# Patient Record
Sex: Female | Born: 1960 | Race: Black or African American | Hispanic: No | Marital: Married | Smoking: Never smoker
Health system: Southern US, Community
[De-identification: ages and names within clinical notes are randomized; demographics above are authoritative.]

---

## 2015-11-27 ENCOUNTER — Emergency Department (HOSPITAL_COMMUNITY)

## 2015-11-27 ENCOUNTER — Encounter (HOSPITAL_COMMUNITY): Payer: Self-pay | Admitting: *Deleted

## 2015-11-27 ENCOUNTER — Inpatient Hospital Stay (HOSPITAL_COMMUNITY)
Admission: EM | Admit: 2015-11-27 | Discharge: 2015-12-09 | DRG: 409 | Disposition: A | Discharge status: Home Health | Attending: General Surgery | Admitting: General Surgery

## 2015-11-27 DIAGNOSIS — Y92234 Operating room of hospital as the place of occurrence of the external cause: Secondary | ICD-10-CM | POA: Diagnosis present

## 2015-11-27 DIAGNOSIS — K8020 Calculus of gallbladder without cholecystitis without obstruction: Secondary | ICD-10-CM

## 2015-11-27 DIAGNOSIS — Y658 Other specified misadventures during surgical and medical care: Secondary | ICD-10-CM | POA: Diagnosis not present

## 2015-11-27 DIAGNOSIS — K219 Gastro-esophageal reflux disease without esophagitis: Secondary | ICD-10-CM | POA: Diagnosis present

## 2015-11-27 DIAGNOSIS — K8000 Calculus of gallbladder with acute cholecystitis without obstruction: Secondary | ICD-10-CM | POA: Diagnosis not present

## 2015-11-27 DIAGNOSIS — K9171 Accidental puncture and laceration of a digestive system organ or structure during a digestive system procedure: Secondary | ICD-10-CM | POA: Diagnosis not present

## 2015-11-27 DIAGNOSIS — K810 Acute cholecystitis: Secondary | ICD-10-CM

## 2015-11-27 DIAGNOSIS — Z79899 Other long term (current) drug therapy: Secondary | ICD-10-CM | POA: Diagnosis not present

## 2015-11-27 DIAGNOSIS — Z5331 Laparoscopic surgical procedure converted to open procedure: Secondary | ICD-10-CM | POA: Diagnosis not present

## 2015-11-27 DIAGNOSIS — K838 Other specified diseases of biliary tract: Secondary | ICD-10-CM

## 2015-11-27 DIAGNOSIS — R112 Nausea with vomiting, unspecified: Secondary | ICD-10-CM | POA: Diagnosis present

## 2015-11-27 DIAGNOSIS — R1011 Right upper quadrant pain: Secondary | ICD-10-CM

## 2015-11-27 LAB — COMPREHENSIVE METABOLIC PANEL
ALK PHOS: 77 U/L (ref 38–126)
ALT: 16 U/L (ref 14–54)
AST: 28 U/L (ref 15–41)
Albumin: 4 g/dL (ref 3.5–5.0)
Anion gap: 15 (ref 5–15)
BUN: 6 mg/dL (ref 6–20)
CALCIUM: 10.1 mg/dL (ref 8.9–10.3)
CO2: 21 mmol/L — AB (ref 22–32)
CREATININE: 0.85 mg/dL (ref 0.44–1.00)
Chloride: 104 mmol/L (ref 101–111)
GFR calc non Af Amer: >60 mL/min (ref >60)
Glucose, Bld: 129 mg/dL — ABNORMAL HIGH (ref 65–99)
Potassium: 3.5 mmol/L (ref 3.5–5.1)
SODIUM: 140 mmol/L (ref 135–145)
Total Bilirubin: 0.5 mg/dL (ref 0.3–1.2)
Total Protein: 8.6 g/dL — ABNORMAL HIGH (ref 6.5–8.1)

## 2015-11-27 LAB — CBC
HCT: 44.1 % (ref 36.0–46.0)
Hemoglobin: 14.2 g/dL (ref 12.0–15.0)
MCH: 27.4 pg (ref 26.0–34.0)
MCHC: 32.2 g/dL (ref 30.0–36.0)
MCV: 85.1 fL (ref 78.0–100.0)
PLATELETS: 323 10*3/uL (ref 150–400)
RBC: 5.18 MIL/uL — AB (ref 3.87–5.11)
RDW: 13.7 % (ref 11.5–15.5)
WBC: 7.8 10*3/uL (ref 4.0–10.5)

## 2015-11-27 LAB — LIPASE, BLOOD: Lipase: 21 U/L (ref 11–51)

## 2015-11-27 LAB — SURGICAL PCR SCREEN
MRSA, PCR: NEGATIVE
STAPHYLOCOCCUS AUREUS: NEGATIVE

## 2015-11-27 MED ORDER — FAMOTIDINE IN NACL 20-0.9 MG/50ML-% IV SOLN
20.0000 mg | Freq: Two times a day (BID) | INTRAVENOUS | Status: DC
  Administered 2015-11-27: 20 mg via INTRAVENOUS
  Filled 2015-11-27 (×2): qty 50

## 2015-11-27 MED ORDER — MORPHINE SULFATE (PF) 4 MG/ML IV SOLN
4.0000 mg | Freq: Once | INTRAVENOUS | Status: AC
  Administered 2015-11-27: 4 mg via INTRAVENOUS
  Filled 2015-11-27: qty 1

## 2015-11-27 MED ORDER — DIPHENHYDRAMINE HCL 25 MG PO CAPS
25.0000 mg | ORAL_CAPSULE | Freq: Four times a day (QID) | ORAL | Status: DC | PRN
  Administered 2015-11-27: 25 mg via ORAL
  Filled 2015-11-27: qty 1

## 2015-11-27 MED ORDER — OXYCODONE-ACETAMINOPHEN 5-325 MG PO TABS
1.0000 | ORAL_TABLET | ORAL | Status: DC | PRN
  Administered 2015-11-27 (×2): 1 via ORAL
  Filled 2015-11-27 (×2): qty 1

## 2015-11-27 MED ORDER — DEXTROSE 5 % IV SOLN
2.0000 g | INTRAVENOUS | Status: DC
  Administered 2015-11-27 – 2015-11-28 (×2): 2 g via INTRAVENOUS
  Filled 2015-11-27 (×3): qty 2

## 2015-11-27 MED ORDER — ONDANSETRON HCL 4 MG/2ML IJ SOLN
4.0000 mg | Freq: Four times a day (QID) | INTRAMUSCULAR | Status: DC | PRN
  Administered 2015-11-27: 4 mg via INTRAVENOUS
  Filled 2015-11-27: qty 2

## 2015-11-27 MED ORDER — ONDANSETRON HCL 4 MG/2ML IJ SOLN
4.0000 mg | Freq: Once | INTRAMUSCULAR | Status: AC
  Administered 2015-11-27: 4 mg via INTRAVENOUS
  Filled 2015-11-27: qty 2

## 2015-11-27 MED ORDER — DIPHENHYDRAMINE HCL 50 MG/ML IJ SOLN: 25.0000 mg | Freq: Four times a day (QID) | INTRAMUSCULAR | Status: DC | PRN

## 2015-11-27 MED ORDER — ACETAMINOPHEN 325 MG PO TABS
650.0000 mg | ORAL_TABLET | Freq: Four times a day (QID) | ORAL | Status: DC | PRN
  Administered 2015-11-28: 650 mg via ORAL
  Filled 2015-11-27: qty 2

## 2015-11-27 MED ORDER — POTASSIUM CHLORIDE IN NACL 20-0.9 MEQ/L-% IV SOLN
INTRAVENOUS | Status: DC
  Administered 2015-11-27: 13:00:00 via INTRAVENOUS
  Filled 2015-11-27 (×4): qty 1000

## 2015-11-27 MED ORDER — HYDROMORPHONE HCL 1 MG/ML IJ SOLN: 0.5000 mg | INTRAMUSCULAR | Status: DC | PRN

## 2015-11-27 MED ORDER — SODIUM CHLORIDE 0.9 % IV BOLUS (SEPSIS)
1000.0000 mL | Freq: Once | INTRAVENOUS | Status: AC
  Administered 2015-11-27: 1000 mL via INTRAVENOUS

## 2015-11-27 MED ORDER — ONDANSETRON 4 MG PO TBDP: 4.0000 mg | ORAL_TABLET | Freq: Four times a day (QID) | ORAL | Status: DC | PRN

## 2015-11-27 MED ORDER — PANTOPRAZOLE SODIUM 40 MG PO TBEC
40.0000 mg | DELAYED_RELEASE_TABLET | Freq: Every day | ORAL | Status: DC
  Administered 2015-11-27: 40 mg via ORAL
  Filled 2015-11-27: qty 1

## 2015-11-27 NOTE — ED Provider Notes (Signed)
CSN: [id number]     Arrival date & time 11/27/15  [id number] History   First MD Initiated Contact with Patient 11/27/15 0831     Chief Complaint  Patient presents with  . Abdominal Pain  . Emesis   (Consider location/radiation/quality/duration/timing/severity/associated sxs/prior Treatment) HPI 55 y.o. female presents to the Emergency Department today complaining of abdominal pain since last night around 7pm. Pt states she [facility] [employer] and felt immediate pain afterwards. Associated N/V x7. No diarrhea. States pain is 10/10 epigastrium with RUQ pain as well. OTC medication without relief. Notes pain feels like a constant twisting sensation. No fevers. No CP/SOB. No Dysuria. No abdominal surgeries. No other symptoms noted    History reviewed. No pertinent past medical history. History reviewed. No pertinent past surgical history. No family history on file. Social History  Substance Use Topics  . Smoking status: Never Smoker   . Smokeless tobacco: None  . Alcohol Use: No   OB History    No data available     Review of Systems ROS reviewed and all are negative for acute change except as noted in the HPI.  Allergies  Review of patient's allergies indicates no known allergies.  Home Medications   Prior to Admission medications   Not on File   BP 156/96 mmHg  Pulse 51  Temp(Src) 98.4 F (36.9 C) (Oral)  Resp 18  Ht 5\' 2"  (1.575 m)  Wt 74.844 kg  BMI 30.17 kg/m2  SpO2 100%  LMP 11/27/2010   Physical Exam  Constitutional: She is oriented to person, place, and time. She appears well-developed and well-nourished.  HENT:  Head: Normocephalic and atraumatic.  Eyes: EOM are normal. Pupils are equal, round, and reactive to light.  Neck: Normal range of motion. Neck supple. No tracheal deviation present.  Cardiovascular: Normal rate, regular rhythm, normal heart sounds and intact distal pulses.   No murmur heard. Pulmonary/Chest: Effort normal and breath sounds normal. No  respiratory distress. She has no wheezes. She has no rales. She exhibits no tenderness.  Abdominal: Soft. Normal appearance and bowel sounds are normal. There is tenderness in the right upper quadrant and epigastric area. There is positive Murphy's sign. There is no rigidity, no rebound, no guarding and no tenderness at McBurney's point.  Musculoskeletal: Normal range of motion.  Neurological: She is alert and oriented to person, place, and time.  Skin: Skin is warm and dry.  Psychiatric: She has a normal mood and affect. Her behavior is normal. Thought content normal.  Nursing note and vitals reviewed.  ED Course  Procedures (including critical care time) Labs Review Labs Reviewed  COMPREHENSIVE METABOLIC PANEL - Abnormal; Notable for the following:    CO2 21 (*)    Glucose, Bld 129 (*)    Total Protein 8.6 (*)    All other components within normal limits  CBC - Abnormal; Notable for the following:    RBC 5.18 (*)    All other components within normal limits  LIPASE, BLOOD   Imaging Review [place] Abdomen Limited Ruq  11/27/2015  CLINICAL DATA:  Mid abdominal pain since 7 p.m.  Vomiting. EXAM: US ABDOMEN LIMITED - RIGHT UPPER QUADRANT COMPARISON:  None. FINDINGS: Gallbladder: There are echogenic gallstones, largest measuring 1.2 cm. Reportedly, the patient does have a sonographic Murphy's sign. Mild gallbladder wall thickening measuring up to 0.4 cm. No significant distention of the gallbladder. Areas of edematous gallbladder wall. Common bile duct: Diameter: 0.6 cm. Liver: No focal lesion identified. Within normal  limits in parenchymal echogenicity. Flow in the main portal vein. IMPRESSION: Cholelithiasis with gallbladder wall thickening and positive sonographic Murphy's sign. Findings are suggestive for acute cholecystitis. Electronically Signed   By: Richarda Overlie M.D.   On: 11/27/2015 10:19   I have personally reviewed and evaluated these images and lab results as part of my medical  decision-making.   EKG Interpretation None      MDM  I have reviewed and evaluated the relevant laboratory values I have reviewed and evaluated the relevant imaging studies. I have reviewed the relevant previous healthcare records. I obtained HPI from historian. Patient discussed with supervising physician  ED Course:  Assessment: Pt is a 55yF who presents with RUQ pain with onset last night. On exam, pt in NAD. Nontoxic/nonseptic appearing. VSS. Afebrile. Lungs CTA. Heart RRR. Abdomen TTP RUQ. Pos Murphys. Lipase neg. Labs unremarkable. US showed acute cholecystitis. Made NPO. Given analgesia, zofran, fluids in ED. Plan is to Admit to General Surgery. Pain controlled on reexamination.    Disposition/Plan:  Admit to General Surgery Pt acknowledges and agrees with plan  Supervising Physician Derwood Kaplan, MD   Final diagnoses:  RUQ pain  Acute cholecystitis      Audry Pili, PA-C 11/27/15 1051  Derwood Kaplan, MD 11/27/15 1625

## 2015-11-27 NOTE — Progress Notes (Signed)
Dr. Andrey CampanileWilson will not be able to do her this afternoon, so we are giving her clear liquids, and PO pain meds as needed she is NPO and we are aiming for 8 AM tomorrow.  Pt informed and she is comfortable with the plan.  She has no questions.

## 2015-11-27 NOTE — ED Notes (Signed)
Patient transported to Ultrasound 

## 2015-11-27 NOTE — ED Notes (Signed)
Pt states abdominal pain and emesis since eating at a restaurant last night.

## 2015-11-27 NOTE — H&P (Signed)
Tiffany Warren is an 55 y.o. female.   Chief Complaint: n/v HPI: 55 year old female who is otherwise healthy who developed acute onset of epigastric pain last evening after eating some food at [employer]. She had multiple episodes of nausea and vomiting overnight. The pain persisted. It went to her back a little bit. She denies prior symptoms. However she has had some issues with heartburn over the past few months and is on protonic. She denies any weight loss. She denies any frequent NSAID use. She denies any jaundice or melena or hematochezia. She denies any prior abdominal surgery. She denies tobacco use. Because the pain was persistent she came to the emergency room this morning.  History reviewed. No pertinent past medical history.  History reviewed. No pertinent past surgical history.  No family history on file. Social History:  reports that she has never smoked. She does not have any smokeless tobacco history on file. She reports that she does not drink alcohol or use illicit drugs.  Allergies: No Known Allergies  Medications Prior to Admission  Medication Sig Dispense Refill  . pantoprazole (PROTONIX) 40 MG tablet Take 40 mg by mouth daily.      Results for orders placed or performed during the hospital encounter of 11/27/15 (from the past 48 hour(s))  Lipase, blood     Status: None   Collection Time: 11/27/15  9:15 AM  Result Value Ref Range   Lipase 21 11 - 51 U/L  Comprehensive metabolic panel     Status: Abnormal   Collection Time: 11/27/15  9:15 AM  Result Value Ref Range   Sodium 140 135 - 145 mmol/L   Potassium 3.5 3.5 - 5.1 mmol/L   Chloride 104 101 - 111 mmol/L   CO2 21 (L) 22 - 32 mmol/L   Glucose, Bld 129 (H) 65 - 99 mg/dL   BUN 6 6 - 20 mg/dL   Creatinine, Ser 0.85 0.44 - 1.00 mg/dL   Calcium 10.1 8.9 - 10.3 mg/dL   Total Protein 8.6 (H) 6.5 - 8.1 g/dL   Albumin 4.0 3.5 - 5.0 g/dL   AST 28 15 - 41 U/L   ALT 16 14 - 54 U/L   Alkaline Phosphatase 77 38 -  126 U/L   Total Bilirubin 0.5 0.3 - 1.2 mg/dL   GFR calc non Af Amer >60 >60 mL/min   GFR calc Af Amer >60 >60 mL/min    Comment: (NOTE) The eGFR has been calculated using the CKD EPI equation. This calculation has not been validated in all clinical situations. eGFR's persistently <60 mL/min signify possible Chronic Kidney Disease.    Anion gap 15 5 - 15  CBC     Status: Abnormal   Collection Time: 11/27/15  9:15 AM  Result Value Ref Range   WBC 7.8 4.0 - 10.5 K/uL   RBC 5.18 (H) 3.87 - 5.11 MIL/uL   Hemoglobin 14.2 12.0 - 15.0 g/dL   HCT 44.1 36.0 - 46.0 %   MCV 85.1 78.0 - 100.0 fL   MCH 27.4 26.0 - 34.0 pg   MCHC 32.2 30.0 - 36.0 g/dL   RDW 13.7 11.5 - 15.5 %   Platelets 323 150 - 400 K/uL   US Abdomen Limited Ruq  11/27/2015  CLINICAL DATA:  Mid abdominal pain since 7 p.m.  Vomiting. EXAM: US ABDOMEN LIMITED - RIGHT UPPER QUADRANT COMPARISON:  None. FINDINGS: Gallbladder: There are echogenic gallstones, largest measuring 1.2 cm. Reportedly, the patient does have a sonographic Murphy's  sign. Mild gallbladder wall thickening measuring up to 0.4 cm. No significant distention of the gallbladder. Areas of edematous gallbladder wall. Common bile duct: Diameter: 0.6 cm. Liver: No focal lesion identified. Within normal limits in parenchymal echogenicity. Flow in the main portal vein. IMPRESSION: Cholelithiasis with gallbladder wall thickening and positive sonographic Murphy's sign. Findings are suggestive for acute cholecystitis. Electronically Signed   By: Markus Daft M.D.   On: 11/27/2015 10:19    Review of Systems  Constitutional: Positive for chills. Negative for fever and weight loss.  HENT: Negative for nosebleeds.   Eyes: Negative for blurred vision.  Respiratory: Negative for shortness of breath.   Cardiovascular: Negative for chest pain, palpitations, orthopnea and PND.       Some DOE  Gastrointestinal: Positive for heartburn, nausea, vomiting and abdominal pain. Negative for  diarrhea and constipation.  Genitourinary: Negative for dysuria and hematuria.  Musculoskeletal: Negative.   Skin: Negative for itching and rash.  Neurological: Negative for dizziness, focal weakness, seizures, loss of consciousness and headaches.       Denies TIAs, amaurosis fugax  Endo/Heme/Allergies: Does not bruise/bleed easily.  Psychiatric/Behavioral: The patient is not nervous/anxious.     Blood pressure 153/77, pulse 47, temperature 98.2 F (36.8 C), temperature source Oral, resp. rate 18, height 5' 3"  (1.6 m), weight 74.844 kg (165 lb), last menstrual period 11/27/2010, SpO2 100 %. Physical Exam  Vitals reviewed. Constitutional: She is oriented to person, place, and time. She appears well-developed and well-nourished. No distress.  HENT:  Head: Normocephalic and atraumatic.  Right Ear: External ear normal.  Left Ear: External ear normal.  Eyes: Conjunctivae are normal. No scleral icterus.  Neck: Normal range of motion. Neck supple. No tracheal deviation present. No thyromegaly present.  Cardiovascular: Normal rate and normal heart sounds.   Respiratory: Effort normal and breath sounds normal. No stridor. No respiratory distress. She has no wheezes.  GI: Soft. She exhibits no distension. There is tenderness in the epigastric area. There is no rebound and no guarding.    Musculoskeletal: She exhibits no edema or tenderness.  Lymphadenopathy:    She has no cervical adenopathy.  Neurological: She is alert and oriented to person, place, and time. She exhibits normal muscle tone.  Skin: Skin is warm and dry. No rash noted. She is not diaphoretic. No erythema. No pallor.  Psychiatric: She has a normal mood and affect. Her behavior is normal. Judgment and thought content normal.     Assessment/Plan Early acute calculous cholecystitis GERD  I believe the patient's symptoms are consistent with gallbladder disease.  We discussed gallbladder disease. We discussed non-operative  and operative management. We discussed the signs & symptoms of acute cholecystitis  I discussed laparoscopic cholecystectomy with IOC in detail.  The patient was shown diagrams detailing the procedure.  We discussed the risks and benefits of a laparoscopic cholecystectomy including, but not limited to bleeding, infection, injury to surrounding structures such as the intestine or liver, bile leak, retained gallstones, need to convert to an open procedure, prolonged diarrhea, blood clots such as  DVT, common bile duct injury, anesthesia risks, and possible need for additional procedures.  We discussed the typical post-operative recovery course. I explained that the likelihood of improvement of their symptoms is good.  Plan admit for ivf, pain control, iv abx Due to operative schedule, surgery unlikely to occur today  Plan surgery in AM NPO p mn  Leighton Ruff. Redmond Pulling, MD, FACS General, Bariatric, & Minimally Invasive Surgery [facility]  [place]    Gayland Curry, MD 11/27/2015, 12:43 PM

## 2015-11-28 ENCOUNTER — Encounter (HOSPITAL_COMMUNITY): Payer: Self-pay

## 2015-11-28 ENCOUNTER — Encounter (HOSPITAL_COMMUNITY): Admission: EM | Disposition: A | Discharge status: Home Health | Payer: Self-pay | Source: Home / Self Care

## 2015-11-28 ENCOUNTER — Observation Stay (HOSPITAL_COMMUNITY): Admitting: Certified Registered Nurse Anesthetist

## 2015-11-28 ENCOUNTER — Observation Stay (HOSPITAL_COMMUNITY)

## 2015-11-28 HISTORY — PX: CHOLECYSTECTOMY: SHX55

## 2015-11-28 LAB — COMPREHENSIVE METABOLIC PANEL
ALBUMIN: 2.7 g/dL — AB (ref 3.5–5.0)
ALK PHOS: 56 U/L (ref 38–126)
ALT: 13 U/L — AB (ref 14–54)
AST: 18 U/L (ref 15–41)
Anion gap: 8 (ref 5–15)
BILIRUBIN TOTAL: 0.4 mg/dL (ref 0.3–1.2)
CALCIUM: 8.8 mg/dL — AB (ref 8.9–10.3)
CO2: 25 mmol/L (ref 22–32)
Chloride: 106 mmol/L (ref 101–111)
Creatinine, Ser: 0.89 mg/dL (ref 0.44–1.00)
GFR calc Af Amer: >60 mL/min (ref >60)
GFR calc non Af Amer: >60 mL/min (ref >60)
GLUCOSE: 110 mg/dL — AB (ref 65–99)
Potassium: 3.9 mmol/L (ref 3.5–5.1)
SODIUM: 139 mmol/L (ref 135–145)
TOTAL PROTEIN: 5.8 g/dL — AB (ref 6.5–8.1)

## 2015-11-28 LAB — CBC
HCT: 38 % (ref 36.0–46.0)
HEMOGLOBIN: 11.7 g/dL — AB (ref 12.0–15.0)
MCH: 26.6 pg (ref 26.0–34.0)
MCHC: 30.8 g/dL (ref 30.0–36.0)
MCV: 86.4 fL (ref 78.0–100.0)
Platelets: 233 10*3/uL (ref 150–400)
RBC: 4.4 MIL/uL (ref 3.87–5.11)
RDW: 14.2 % (ref 11.5–15.5)
WBC: 5.6 10*3/uL (ref 4.0–10.5)

## 2015-11-28 LAB — LIPASE, BLOOD: LIPASE: 20 U/L (ref 11–51)

## 2015-11-28 SURGERY — CHOLECYSTECTOMY
Anesthesia: General | Site: Abdomen

## 2015-11-28 MED ORDER — LACTATED RINGERS IV SOLN
INTRAVENOUS | Status: DC
  Administered 2015-11-28 (×3): via INTRAVENOUS

## 2015-11-28 MED ORDER — IOPAMIDOL (ISOVUE-300) INJECTION 61%
INTRAVENOUS | Status: DC | PRN
  Administered 2015-11-28: 15 mL

## 2015-11-28 MED ORDER — PHENYLEPHRINE 40 MCG/ML (10ML) SYRINGE FOR IV PUSH (FOR BLOOD PRESSURE SUPPORT)
PREFILLED_SYRINGE | INTRAVENOUS | Status: DC | PRN
  Administered 2015-11-28: 40 ug via INTRAVENOUS
  Administered 2015-11-28 (×2): 80 ug via INTRAVENOUS

## 2015-11-28 MED ORDER — FENTANYL CITRATE (PF) 250 MCG/5ML IJ SOLN
INTRAMUSCULAR | Status: AC
  Filled 2015-11-28: qty 5

## 2015-11-28 MED ORDER — IOPAMIDOL (ISOVUE-300) INJECTION 61%
INTRAVENOUS | Status: AC
  Filled 2015-11-28: qty 50

## 2015-11-28 MED ORDER — BUPIVACAINE-EPINEPHRINE (PF) 0.25% -1:200000 IJ SOLN
INTRAMUSCULAR | Status: AC
  Filled 2015-11-28: qty 30

## 2015-11-28 MED ORDER — KETOROLAC TROMETHAMINE 30 MG/ML IJ SOLN: 30.0000 mg | Freq: Once | INTRAMUSCULAR | Status: DC | PRN

## 2015-11-28 MED ORDER — ONDANSETRON HCL 4 MG/2ML IJ SOLN
INTRAMUSCULAR | Status: AC
  Filled 2015-11-28: qty 2

## 2015-11-28 MED ORDER — ONDANSETRON 4 MG PO TBDP
4.0000 mg | ORAL_TABLET | Freq: Four times a day (QID) | ORAL | Status: DC | PRN
  Administered 2015-12-07: 4 mg via ORAL
  Filled 2015-11-28: qty 1

## 2015-11-28 MED ORDER — FAMOTIDINE IN NACL 20-0.9 MG/50ML-% IV SOLN
20.0000 mg | INTRAVENOUS | Status: DC
  Administered 2015-11-28 – 2015-12-05 (×8): 20 mg via INTRAVENOUS
  Filled 2015-11-28 (×10): qty 50

## 2015-11-28 MED ORDER — GLYCOPYRROLATE 0.2 MG/ML IV SOSY
PREFILLED_SYRINGE | INTRAVENOUS | Status: DC | PRN
  Administered 2015-11-28: 0.4 mg via INTRAVENOUS

## 2015-11-28 MED ORDER — MIDAZOLAM HCL 2 MG/2ML IJ SOLN
INTRAMUSCULAR | Status: AC
  Filled 2015-11-28: qty 2

## 2015-11-28 MED ORDER — HYDROMORPHONE HCL 1 MG/ML IJ SOLN
0.2500 mg | INTRAMUSCULAR | Status: DC | PRN
  Administered 2015-11-28 (×4): 0.5 mg via INTRAVENOUS

## 2015-11-28 MED ORDER — LIDOCAINE 2% (20 MG/ML) 5 ML SYRINGE
INTRAMUSCULAR | Status: AC
  Filled 2015-11-28: qty 5

## 2015-11-28 MED ORDER — HYDROMORPHONE 1 MG/ML IV SOLN
INTRAVENOUS | Status: DC
  Administered 2015-11-28 (×2): 0.9 mg via INTRAVENOUS
  Administered 2015-11-28: 1.5 mg via INTRAVENOUS
  Administered 2015-11-28: 13:00:00 via INTRAVENOUS
  Administered 2015-11-29: 1.8 mg via INTRAVENOUS
  Administered 2015-11-29: 1.5 mg via INTRAVENOUS
  Administered 2015-11-29: 2.1 mg via INTRAVENOUS
  Administered 2015-11-29: 0.9 mg via INTRAVENOUS
  Administered 2015-11-29: 1.8 mg via INTRAVENOUS
  Administered 2015-11-29: 0.9 mg via INTRAVENOUS
  Administered 2015-11-30: 0.3 mg via INTRAVENOUS
  Administered 2015-11-30: 1.8 mg via INTRAVENOUS
  Administered 2015-11-30: 1.5 mg via INTRAVENOUS
  Administered 2015-11-30: 0.9 mg via INTRAVENOUS
  Administered 2015-11-30: 1.8 mg via INTRAVENOUS
  Administered 2015-12-01: 0.6 mg via INTRAVENOUS
  Administered 2015-12-01: 1.2 mg via INTRAVENOUS
  Administered 2015-12-01: 0.9 mg via INTRAVENOUS

## 2015-11-28 MED ORDER — PROPOFOL 10 MG/ML IV BOLUS
INTRAVENOUS | Status: DC | PRN
  Administered 2015-11-28: 30 mg via INTRAVENOUS
  Administered 2015-11-28: 150 mg via INTRAVENOUS
  Administered 2015-11-28: 20 mg via INTRAVENOUS

## 2015-11-28 MED ORDER — ROCURONIUM BROMIDE 50 MG/5ML IV SOLN
INTRAVENOUS | Status: AC
  Filled 2015-11-28: qty 1

## 2015-11-28 MED ORDER — SODIUM CHLORIDE 0.9% FLUSH: 9.0000 mL | INTRAVENOUS | Status: DC | PRN

## 2015-11-28 MED ORDER — ONDANSETRON HCL 4 MG/2ML IJ SOLN
INTRAMUSCULAR | Status: DC | PRN
Start: 2015-11-28 — End: 2015-11-28
  Administered 2015-11-28: 4 mg via INTRAVENOUS

## 2015-11-28 MED ORDER — PHENYLEPHRINE 40 MCG/ML (10ML) SYRINGE FOR IV PUSH (FOR BLOOD PRESSURE SUPPORT)
PREFILLED_SYRINGE | INTRAVENOUS | Status: AC
  Filled 2015-11-28: qty 10

## 2015-11-28 MED ORDER — DEXAMETHASONE SODIUM PHOSPHATE 10 MG/ML IJ SOLN
INTRAMUSCULAR | Status: AC
  Filled 2015-11-28: qty 1

## 2015-11-28 MED ORDER — LIDOCAINE 2% (20 MG/ML) 5 ML SYRINGE
INTRAMUSCULAR | Status: DC | PRN
  Administered 2015-11-28: 60 mg via INTRAVENOUS

## 2015-11-28 MED ORDER — HYDROMORPHONE HCL 1 MG/ML IJ SOLN
INTRAMUSCULAR | Status: AC
  Administered 2015-11-28: 0.5 mg via INTRAVENOUS
  Filled 2015-11-28: qty 1

## 2015-11-28 MED ORDER — SODIUM CHLORIDE 0.9 % IR SOLN
Status: DC | PRN
  Administered 2015-11-28: 1000 mL

## 2015-11-28 MED ORDER — MIDAZOLAM HCL 5 MG/5ML IJ SOLN
INTRAMUSCULAR | Status: DC | PRN
  Administered 2015-11-28: 2 mg via INTRAVENOUS

## 2015-11-28 MED ORDER — NEOSTIGMINE METHYLSULFATE 5 MG/5ML IV SOSY
PREFILLED_SYRINGE | INTRAVENOUS | Status: DC | PRN
  Administered 2015-11-28: 2 mg via INTRAVENOUS

## 2015-11-28 MED ORDER — BUPIVACAINE-EPINEPHRINE 0.25% -1:200000 IJ SOLN
INTRAMUSCULAR | Status: DC | PRN
  Administered 2015-11-28: 14 mL

## 2015-11-28 MED ORDER — DEXTROSE 5 % IV SOLN
2.0000 g | INTRAVENOUS | Status: DC
  Administered 2015-11-29 – 2015-12-07 (×9): 2 g via INTRAVENOUS
  Filled 2015-11-28 (×11): qty 2

## 2015-11-28 MED ORDER — GLYCOPYRROLATE 0.2 MG/ML IV SOSY
PREFILLED_SYRINGE | INTRAVENOUS | Status: AC
Start: 2015-11-28 — End: 2015-11-28
  Filled 2015-11-28: qty 3

## 2015-11-28 MED ORDER — DIPHENHYDRAMINE HCL 12.5 MG/5ML PO ELIX: 12.5000 mg | ORAL_SOLUTION | Freq: Four times a day (QID) | ORAL | Status: DC | PRN

## 2015-11-28 MED ORDER — ONDANSETRON HCL 4 MG/2ML IJ SOLN
4.0000 mg | Freq: Four times a day (QID) | INTRAMUSCULAR | Status: DC | PRN
  Administered 2015-12-04 – 2015-12-06 (×2): 4 mg via INTRAVENOUS
  Filled 2015-11-28: qty 2

## 2015-11-28 MED ORDER — PROPOFOL 10 MG/ML IV BOLUS
INTRAVENOUS | Status: AC
  Filled 2015-11-28: qty 20

## 2015-11-28 MED ORDER — DEXAMETHASONE SODIUM PHOSPHATE 10 MG/ML IJ SOLN
INTRAMUSCULAR | Status: DC | PRN
  Administered 2015-11-28: 10 mg via INTRAVENOUS

## 2015-11-28 MED ORDER — ROCURONIUM BROMIDE 10 MG/ML (PF) SYRINGE
PREFILLED_SYRINGE | INTRAVENOUS | Status: DC | PRN
  Administered 2015-11-28 (×3): 5 mg via INTRAVENOUS
  Administered 2015-11-28 (×2): 10 mg via INTRAVENOUS
  Administered 2015-11-28: 40 mg via INTRAVENOUS
  Administered 2015-11-28 (×2): 5 mg via INTRAVENOUS

## 2015-11-28 MED ORDER — NEOSTIGMINE METHYLSULFATE 5 MG/5ML IV SOSY
PREFILLED_SYRINGE | INTRAVENOUS | Status: AC
  Filled 2015-11-28: qty 5

## 2015-11-28 MED ORDER — 0.9 % SODIUM CHLORIDE (POUR BTL) OPTIME
TOPICAL | Status: DC | PRN
  Administered 2015-11-28: 1000 mL

## 2015-11-28 MED ORDER — ONDANSETRON HCL 4 MG/2ML IJ SOLN
4.0000 mg | Freq: Four times a day (QID) | INTRAMUSCULAR | Status: DC | PRN
  Administered 2015-12-02: 4 mg via INTRAVENOUS
  Filled 2015-11-28 (×2): qty 2

## 2015-11-28 MED ORDER — FENTANYL CITRATE (PF) 100 MCG/2ML IJ SOLN
INTRAMUSCULAR | Status: DC | PRN
  Administered 2015-11-28 (×6): 50 ug via INTRAVENOUS
  Administered 2015-11-28 (×4): 25 ug via INTRAVENOUS
  Administered 2015-11-28 (×2): 50 ug via INTRAVENOUS

## 2015-11-28 MED ORDER — NALOXONE HCL 0.4 MG/ML IJ SOLN: 0.4000 mg | INTRAMUSCULAR | Status: DC | PRN

## 2015-11-28 MED ORDER — ENOXAPARIN SODIUM 40 MG/0.4ML ~~LOC~~ SOLN
40.0000 mg | SUBCUTANEOUS | Status: DC
  Administered 2015-11-29 – 2015-12-09 (×11): 40 mg via SUBCUTANEOUS
  Filled 2015-11-28 (×11): qty 0.4

## 2015-11-28 MED ORDER — HYDRALAZINE HCL 20 MG/ML IJ SOLN: 10.0000 mg | INTRAMUSCULAR | Status: DC | PRN

## 2015-11-28 MED ORDER — HYDROMORPHONE 1 MG/ML IV SOLN
INTRAVENOUS | Status: AC
  Filled 2015-11-28: qty 25

## 2015-11-28 MED ORDER — PROMETHAZINE HCL 25 MG/ML IJ SOLN: 6.2500 mg | INTRAMUSCULAR | Status: DC | PRN

## 2015-11-28 MED ORDER — POTASSIUM CHLORIDE IN NACL 20-0.9 MEQ/L-% IV SOLN
INTRAVENOUS | Status: DC
  Administered 2015-11-28 – 2015-12-05 (×14): via INTRAVENOUS
  Filled 2015-11-28 (×17): qty 1000

## 2015-11-28 MED ORDER — DIPHENHYDRAMINE HCL 50 MG/ML IJ SOLN
12.5000 mg | Freq: Four times a day (QID) | INTRAMUSCULAR | Status: DC | PRN
  Administered 2015-11-29 – 2015-11-30 (×6): 12.5 mg via INTRAVENOUS
  Filled 2015-11-28 (×6): qty 1

## 2015-11-28 SURGICAL SUPPLY — 85 items
APPLIER CLIP ROT 10 11.4 M/L (STAPLE) ×4
BENZOIN TINCTURE PRP APPL 2/3 (GAUZE/BANDAGES/DRESSINGS) ×4 IMPLANT
BLADE SURG 10 STRL SS (BLADE) ×4 IMPLANT
BLADE SURG 15 STRL LF DISP TIS (BLADE) ×2 IMPLANT
BLADE SURG 15 STRL SS (BLADE) ×2
BLADE SURG ROTATE 9660 (MISCELLANEOUS) IMPLANT
CANISTER SUCTION 2500CC (MISCELLANEOUS) ×4 IMPLANT
CATH REDDICK CHOLANGI 4FR 50CM (CATHETERS) ×4 IMPLANT
CHLORAPREP W/TINT 26ML (MISCELLANEOUS) ×4 IMPLANT
CLIP APPLIE ROT 10 11.4 M/L (STAPLE) ×2 IMPLANT
CLOSURE STERI-STRIP 1/2X4 (GAUZE/BANDAGES/DRESSINGS) ×1
CLSR STERI-STRIP ANTIMIC 1/2X4 (GAUZE/BANDAGES/DRESSINGS) ×3 IMPLANT
COVER MAYO STAND STRL (DRAPES) ×4 IMPLANT
COVER SURGICAL LIGHT HANDLE (MISCELLANEOUS) ×4 IMPLANT
DRAIN CHANNEL 19F RND (DRAIN) ×8 IMPLANT
DRAPE C-ARM 42X72 X-RAY (DRAPES) ×8 IMPLANT
DRSG COVADERM 4X10 (GAUZE/BANDAGES/DRESSINGS) ×4 IMPLANT
DRSG COVADERM 4X8 (GAUZE/BANDAGES/DRESSINGS) ×4 IMPLANT
DRSG PAD ABDOMINAL 8X10 ST (GAUZE/BANDAGES/DRESSINGS) ×4 IMPLANT
DRSG TEGADERM 2-3/8X2-3/4 SM (GAUZE/BANDAGES/DRESSINGS) ×4 IMPLANT
DRSG TEGADERM 4X4.75 (GAUZE/BANDAGES/DRESSINGS) ×4 IMPLANT
ELECT BLADE 6.5 EXT (BLADE) ×4 IMPLANT
ELECT REM PT RETURN 9FT ADLT (ELECTROSURGICAL) ×4
ELECTRODE REM PT RTRN 9FT ADLT (ELECTROSURGICAL) ×2 IMPLANT
EVACUATOR SILICONE 100CC (DRAIN) ×8 IMPLANT
FILTER SMOKE EVAC LAPAROSHD (FILTER) ×4 IMPLANT
GAUZE SPONGE 2X2 8PLY STRL LF (GAUZE/BANDAGES/DRESSINGS) ×2 IMPLANT
GLOVE BIO SURGEON STRL SZ 6.5 (GLOVE) ×3 IMPLANT
GLOVE BIO SURGEON STRL SZ7 (GLOVE) ×8 IMPLANT
GLOVE BIO SURGEONS STRL SZ 6.5 (GLOVE) ×1
GLOVE BIOGEL PI IND STRL 6.5 (GLOVE) ×2 IMPLANT
GLOVE BIOGEL PI IND STRL 7.0 (GLOVE) ×2 IMPLANT
GLOVE BIOGEL PI IND STRL 7.5 (GLOVE) ×2 IMPLANT
GLOVE BIOGEL PI INDICATOR 6.5 (GLOVE) ×2
GLOVE BIOGEL PI INDICATOR 7.0 (GLOVE) ×2
GLOVE BIOGEL PI INDICATOR 7.5 (GLOVE) ×2
GLOVE SURG ORTHO 8.0 STRL STRW (GLOVE) ×4 IMPLANT
GOWN STRL REUS W/ TWL LRG LVL3 (GOWN DISPOSABLE) ×8 IMPLANT
GOWN STRL REUS W/ TWL XL LVL3 (GOWN DISPOSABLE) ×2 IMPLANT
GOWN STRL REUS W/TWL LRG LVL3 (GOWN DISPOSABLE) ×8
GOWN STRL REUS W/TWL XL LVL3 (GOWN DISPOSABLE) ×2
HANDLE SUCTION POOLE (INSTRUMENTS) ×2 IMPLANT
IV CATH 14GX2 1/4 (CATHETERS) ×4 IMPLANT
KIT BASIN OR (CUSTOM PROCEDURE TRAY) ×4 IMPLANT
KIT ROOM TURNOVER OR (KITS) ×4 IMPLANT
NS IRRIG 1000ML POUR BTL (IV SOLUTION) ×4 IMPLANT
PAD ARMBOARD 7.5X6 YLW CONV (MISCELLANEOUS) ×4 IMPLANT
PENCIL BUTTON HOLSTER BLD 10FT (ELECTRODE) ×4 IMPLANT
POUCH SPECIMEN RETRIEVAL 10MM (ENDOMECHANICALS) ×4 IMPLANT
RELOAD PROXIMATE 75MM BLUE (ENDOMECHANICALS) ×4 IMPLANT
SCISSORS LAP 5X35 DISP (ENDOMECHANICALS) ×4 IMPLANT
SET CHOLANGIOGRAPH 5 50 .035 (SET/KITS/TRAYS/PACK) ×12 IMPLANT
SET IRRIG TUBING LAPAROSCOPIC (IRRIGATION / IRRIGATOR) ×4 IMPLANT
SLEEVE ENDOPATH XCEL 5M (ENDOMECHANICALS) ×4 IMPLANT
SPECIMEN JAR SMALL (MISCELLANEOUS) ×4 IMPLANT
SPONGE GAUZE 2X2 STER 10/PKG (GAUZE/BANDAGES/DRESSINGS) ×2
SPONGE GAUZE 4X4 12PLY STER LF (GAUZE/BANDAGES/DRESSINGS) ×4 IMPLANT
SPONGE LAP 18X18 X RAY DECT (DISPOSABLE) ×12 IMPLANT
STAPLER GUN LINEAR PROX 60 (STAPLE) ×4 IMPLANT
STAPLER PROXIMATE 75MM BLUE (STAPLE) ×4 IMPLANT
STAPLER VISISTAT 35W (STAPLE) ×4 IMPLANT
SUCTION POOLE HANDLE (INSTRUMENTS) ×4
SUT ETHILON 3 0 FSL (SUTURE) ×8 IMPLANT
SUT MNCRL AB 4-0 PS2 18 (SUTURE) ×4 IMPLANT
SUT PDS AB 1 TP1 96 (SUTURE) ×12 IMPLANT
SUT PROLENE 5 0 RB 1 DA (SUTURE) ×24 IMPLANT
SUT SILK 2 0 (SUTURE) ×2
SUT SILK 2 0 SH CR/8 (SUTURE) ×4 IMPLANT
SUT SILK 2-0 18XBRD TIE 12 (SUTURE) ×2 IMPLANT
SUT SILK 3 0 (SUTURE) ×2
SUT SILK 3 0 SH CR/8 (SUTURE) ×8 IMPLANT
SUT SILK 3-0 18XBRD TIE 12 (SUTURE) ×2 IMPLANT
TAPE CLOTH SURG 6X10 WHT LF (GAUZE/BANDAGES/DRESSINGS) ×4 IMPLANT
TOWEL OR 17X24 6PK STRL BLUE (TOWEL DISPOSABLE) ×4 IMPLANT
TOWEL OR 17X26 10 PK STRL BLUE (TOWEL DISPOSABLE) ×4 IMPLANT
TRAY FOLEY CATH 14FR (SET/KITS/TRAYS/PACK) ×4 IMPLANT
TRAY FOLEY CATH 16FRSI W/METER (SET/KITS/TRAYS/PACK) IMPLANT
TRAY LAPAROSCOPIC MC (CUSTOM PROCEDURE TRAY) ×4 IMPLANT
TROCAR XCEL BLUNT TIP 100MML (ENDOMECHANICALS) ×4 IMPLANT
TROCAR XCEL NON-BLD 11X100MML (ENDOMECHANICALS) ×4 IMPLANT
TROCAR XCEL NON-BLD 5MMX100MML (ENDOMECHANICALS) ×4 IMPLANT
TUBE CONNECTING 12'X1/4 (SUCTIONS) ×1
TUBE CONNECTING 12X1/4 (SUCTIONS) ×3 IMPLANT
TUBING INSUFFLATION (TUBING) ×4 IMPLANT
YANKAUER SUCT BULB TIP NO VENT (SUCTIONS) ×4 IMPLANT

## 2015-11-28 NOTE — Addendum Note (Signed)
Addendum  created 11/28/15 1256 by Daiva EvesBritney W Payten Hobin, CRNA   Modules edited: Anesthesia Medication Administration

## 2015-11-28 NOTE — Op Note (Signed)
Preop diagnosis: Acute cholecystitis Postop diagnosis: Acute cholecystitis/ common bile duct injury Bernadene Person(Strasburg type III) Procedure performed: Laparoscopic cholecystectomy with intraoperative cholangiogram converted to open cholecystectomy with Roux-en-Y hepaticojejunostomy Surgeon:Stela Iwasaki K. Asst.: Dr. Darnell Levelodd Gerkin Anesthesia: Gen. endotracheal Indications: This is a 55 year old female who presents with acute onset of right upper quadrant abdominal pain after eating a meal at a restaurant. In retrospect, she has had postprandial symptoms for over a year. She also has had frequent nausea. Ultrasound showed gallstones and wall thickening. She was admitted to the hospital presents now for cholecystectomy.  Operative findings: The patient has a very short (5 mm) cystic duct and the common bile duct is tented up towards the right side. There was a [place] type III common bile duct injury that was recognized immediately.  Description of procedure: The patient is brought to the operating room and placed in the supine position on the operating room table. After an adequate level of general anesthesia was obtained, the patient's abdomen was prepped with ChloraPrep and draped sterile fashion. A timeout was taken to ensure the proper patient and proper procedure. We infiltrated the area below the umbilicus with 0.25% Marcaine with epinephrine. A transverse incision was made. Dissection was carried down the fascia which was entered bluntly. A stay suture of 0 Vicryl was placed on the fascial opening. The Hassan cannula was inserted and secured to stay suture. Pneumoperitoneum was obtained by insufflating CO2 maintaining a maximum pressure of 15 mmHg. The laparoscope was inserted and the patient was positioned in reverse Trendelenburg tilted to her left side. An 11 mm port was placed in the subxiphoid position.Two 5 mm ports were placed in the right upper quadrant.  The gallbladder was quite thickened and  distended. We grasped the fundus with a clamp and lifted over the edge liver. We open the peritoneum Trillium around the hilum of the gallbladder. We identified a single structure that appeared to be exiting the gallbladder. We were able to bluntly dissect around this structure. We ligated this duct with a clip distally. A small opening was created on the duct and a Cook cholangiogram catheter was inserted and threaded into the duct. This was secured with a clip. A cholangiogram was then obtained that showed good flow distally in the biliary tree. There was some flow proximally but the bifurcation was not visualized. At this point, I felt that the bifurcation of the hepatic ducts was not visualized because of the lack of back pressure for the cholangiogram. The catheter was removed and the duct was ligated with clips and divided. We identified a vessel entering the gallbladder and this was also ligated with clips and divided. We then began dissecting the gallbladder free from the liver with cautery. However we encountered a another structure medially that began to leak some bile as we were dissected it.  We used a balloon tip Reddick catheter placed into this more medial structure. A cholangiogram was then obtained that showed flow proximally in the biliary tree but no flow distally. It then became apparent that we had transected the common bile duct. We converted to an open procedure via midline incision. The trochars were removed and the umbilical fascia was closed with the pursestring suture. We entered the peritoneal cavity and used the Bookwalter retractor for exposure. We retracted the liver superiorly. The bowel was packed away inferiorly. We grasped the gallbladder with a Kelly clamp and used cautery to dissect it free from the liver. We carefully dissected down until we Did not  of the gallbladder. There is virtually no cystic duct that is identified. We identified the common bile duct segment that had been  transected. The distal, bile duct clips are intact and there is no sign of leak. We removed the gallbladder as well as the amputated segment of common bile duct. We dissected the common bile duct and cystic duct on the table and identified the 5 mm length of the cystic duct. This was sent for pathologic examination.  We then examined the small bowel. The ligament of Treitz was identified. 30 cm distal to the ligament of Treitz we divided the bowel with a GIA 75 stapler. We created a retrocolic window in the mesentery of the transverse colon to allow the Roux limb to reach up to the liver. We created a side-to-side jejuno-jejunostomy with a GIA-75 stapler. A TA 60 stapler was used to close the common defect. Reinforcing sutures of 3-0 silk were placed at the crotch and oversewing the staple line. We brought the Roux limb up to the liver where it reached with no tension. We carefully dissected up around the remaining stump of common hepatic duct. We found our previously encountered ductotomy. We transected the common hepatic duct at this ductotomy.  We then created a hepaticojejunostomy with 5 sutures of 5-0 Prolene. We placed all of her sutures and then brought the Roux limb up to the common hepatic duct stump. The sutures were then tied down to create the anastomosis. We carefully inspected this area and there is no sign of bile leak. We brought in to 19 French drains through the port sites on the right side. The more lateral drain is placed posterior to the Roux limb anastomosis. The more medial drain is placed anterior to the anastomosis. The drains were secured with 3-0 nylon sutures. These sponges and retractors were then removed and the fascia was reapproximated with double-stranded #1 PDS suture. The subcutaneous tissues were irrigated and staples were used to close the skin. Sterile dressings were applied. The patient was then extubated and brought to the recovery room in stable condition. All sponge,  instrument, and needle counts are correct.  Wilmon Arms. Corliss Skains, MD, [facility]  General/ Trauma Surgery  11/28/2015 1:07 PM

## 2015-11-28 NOTE — Progress Notes (Signed)
Report called to Gaynelle Aduobbie, RN in short stay area. Patient going to surgery

## 2015-11-28 NOTE — Transfer of Care (Signed)
Immediate Anesthesia Transfer of Care Note  Patient: Tiffany Warren  Procedure(s) Performed: Procedure(s): LAPAROSCOPIC CHOLECYSTECTOMY WITH INTRAOPERATIVE CHOLANGIOGRAM (N/A)  Patient Location: PACU  Anesthesia Type:General  Level of Consciousness: awake, alert  and oriented  Airway & Oxygen Therapy: Patient Spontanous Breathing and Patient connected to nasal cannula oxygen  Post-op Assessment: Report given to RN and Post -op Vital signs reviewed and stable  Post vital signs: Reviewed and stable  Last Vitals:  Filed Vitals:   11/28/15 0459 11/28/15 0740  BP: 142/74 127/77  Pulse: 68 62  Temp: 36.7 C 36.8 C  Resp: 18 18    Last Pain:  Filed Vitals:   11/28/15 1226  PainSc: Asleep      Patients Stated Pain Goal: 2 (11/27/15 2227)  Complications: No apparent anesthesia complications

## 2015-11-28 NOTE — Anesthesia Preprocedure Evaluation (Addendum)
Anesthesia Evaluation  Patient identified by MRN, date of birth, ID band Patient awake    Reviewed: Allergy & Precautions, NPO status , Patient's Chart, lab work & pertinent test results  History of Anesthesia Complications (+) history of anesthetic complications  Airway Mallampati: II  TM Distance: >3 FB Neck ROM: Full    Dental no notable dental hx. (+) Teeth Intact, Dental Advisory Given   Pulmonary neg pulmonary ROS,    Pulmonary exam normal breath sounds clear to auscultation       Cardiovascular negative cardio ROS Normal cardiovascular exam Rhythm:Regular Rate:Normal     Neuro/Psych negative neurological ROS  negative psych ROS   GI/Hepatic negative GI ROS, Neg liver ROS,   Endo/Other  negative endocrine ROS  Renal/GU negative Renal ROS  negative genitourinary   Musculoskeletal negative musculoskeletal ROS (+)   Abdominal   Peds negative pediatric ROS (+)  Hematology negative hematology ROS (+)   Anesthesia Other Findings   Reproductive/Obstetrics negative OB ROS                            Anesthesia Physical Anesthesia Plan  ASA: I  Anesthesia Plan: General   Post-op Pain Management:    Induction: Intravenous  Airway Management Planned: Oral ETT  Additional Equipment:   Intra-op Plan:   Post-operative Plan: Extubation in OR  Informed Consent: I have reviewed the patients History and Physical, chart, labs and discussed the procedure including the risks, benefits and alternatives for the proposed anesthesia with the patient or authorized representative who has indicated his/her understanding and acceptance.   Dental advisory given  Plan Discussed with: CRNA and Surgeon  Anesthesia Plan Comments:         Anesthesia Quick Evaluation

## 2015-11-28 NOTE — Anesthesia Procedure Notes (Signed)
Procedure Name: Intubation Date/Time: 11/28/2015 9:08 AM Performed by: Daiva EvesAVENEL, Teofil Maniaci W Pre-anesthesia Checklist: Patient identified, Timeout performed, Emergency Drugs available, Suction available and Patient being monitored Patient Re-evaluated:Patient Re-evaluated prior to inductionOxygen Delivery Method: Circle system utilized Preoxygenation: Pre-oxygenation with 100% oxygen Intubation Type: IV induction Ventilation: Mask ventilation without difficulty Laryngoscope Size: Mac and 3 Grade View: Grade I Tube type: Oral Tube size: 7.5 mm Number of attempts: 1 Airway Equipment and Method: Stylet Placement Confirmation: breath sounds checked- equal and bilateral,  ETT inserted through vocal cords under direct vision,  positive ETCO2 and CO2 detector Secured at: 22 cm Tube secured with: Tape Dental Injury: Teeth and Oropharynx as per pre-operative assessment

## 2015-11-28 NOTE — Anesthesia Postprocedure Evaluation (Signed)
Anesthesia Post Note  Patient: Tiffany Warren  Procedure(s) Performed: Procedure(s) (LRB): LAPAROSCOPIC CHOLECYSTECTOMY WITH INTRAOPERATIVE CHOLANGIOGRAM (N/A)  Patient location during evaluation: PACU Anesthesia Type: General Level of consciousness: awake and alert Pain management: pain level controlled Vital Signs Assessment: post-procedure vital signs reviewed and stable Respiratory status: spontaneous breathing, nonlabored ventilation, respiratory function stable and patient connected to nasal cannula oxygen Cardiovascular status: blood pressure returned to baseline and stable Postop Assessment: no signs of nausea or vomiting Anesthetic complications: no    Last Vitals:  Filed Vitals:   11/28/15 0740 11/28/15 1220  BP: 127/77 149/83  Pulse: 62 68  Temp: 36.8 C 37.1 C  Resp: 18 23    Last Pain:  Filed Vitals:   11/28/15 1235  PainSc: 0-No pain                 Maksymilian Mabey S

## 2015-11-29 DIAGNOSIS — Y92234 Operating room of hospital as the place of occurrence of the external cause: Secondary | ICD-10-CM | POA: Diagnosis present

## 2015-11-29 DIAGNOSIS — K9171 Accidental puncture and laceration of a digestive system organ or structure during a digestive system procedure: Secondary | ICD-10-CM | POA: Diagnosis not present

## 2015-11-29 DIAGNOSIS — R112 Nausea with vomiting, unspecified: Secondary | ICD-10-CM | POA: Diagnosis present

## 2015-11-29 DIAGNOSIS — Z79899 Other long term (current) drug therapy: Secondary | ICD-10-CM | POA: Diagnosis not present

## 2015-11-29 DIAGNOSIS — Y658 Other specified misadventures during surgical and medical care: Secondary | ICD-10-CM | POA: Diagnosis present

## 2015-11-29 DIAGNOSIS — Z5331 Laparoscopic surgical procedure converted to open procedure: Secondary | ICD-10-CM | POA: Diagnosis not present

## 2015-11-29 DIAGNOSIS — K219 Gastro-esophageal reflux disease without esophagitis: Secondary | ICD-10-CM | POA: Diagnosis present

## 2015-11-29 DIAGNOSIS — K8000 Calculus of gallbladder with acute cholecystitis without obstruction: Secondary | ICD-10-CM | POA: Diagnosis present

## 2015-11-29 LAB — CBC
HEMATOCRIT: 37.5 % (ref 36.0–46.0)
Hemoglobin: 11.6 g/dL — ABNORMAL LOW (ref 12.0–15.0)
MCH: 26.8 pg (ref 26.0–34.0)
MCHC: 30.9 g/dL (ref 30.0–36.0)
MCV: 86.6 fL (ref 78.0–100.0)
Platelets: 257 10*3/uL (ref 150–400)
RBC: 4.33 MIL/uL (ref 3.87–5.11)
RDW: 14.3 % (ref 11.5–15.5)
WBC: 9.6 10*3/uL (ref 4.0–10.5)

## 2015-11-29 LAB — COMPREHENSIVE METABOLIC PANEL
ALBUMIN: 2.5 g/dL — AB (ref 3.5–5.0)
ALK PHOS: 54 U/L (ref 38–126)
ALT: 66 U/L — AB (ref 14–54)
AST: 70 U/L — AB (ref 15–41)
Anion gap: 8 (ref 5–15)
BILIRUBIN TOTAL: 0.4 mg/dL (ref 0.3–1.2)
BUN: 5 mg/dL — AB (ref 6–20)
CO2: 25 mmol/L (ref 22–32)
Calcium: 8.7 mg/dL — ABNORMAL LOW (ref 8.9–10.3)
Chloride: 107 mmol/L (ref 101–111)
Creatinine, Ser: 0.87 mg/dL (ref 0.44–1.00)
GFR calc Af Amer: >60 mL/min (ref >60)
GFR calc non Af Amer: >60 mL/min (ref >60)
GLUCOSE: 102 mg/dL — AB (ref 65–99)
POTASSIUM: 4.7 mmol/L (ref 3.5–5.1)
Sodium: 140 mmol/L (ref 135–145)
TOTAL PROTEIN: 6 g/dL — AB (ref 6.5–8.1)

## 2015-11-29 NOTE — Progress Notes (Signed)
1 Day Post-Op  Subjective: Very pleasant and cooperative. Says she is doing well and pain is well controlled. Tolerating NG tube Foley just removed  JP drains are bile tinged.  Moderate output.  335 mL since OR yesterday morning. WBC 9600.  Hemoglobin 11.6, stable.  AST and ALT slightly elevated.  Bilirubin 0.4.   Objective: Vital signs in last 24 hours: Temp:  [97.6 F (36.4 C)-98.9 F (37.2 C)] 97.6 F (36.4 C) (06/04 0702) Pulse Rate:  [59-73] 68 (06/04 0702) Resp:  [15-23] 15 (06/04 0825) BP: (113-154)/(67-84) 132/80 mmHg (06/04 0702) SpO2:  [96 %-100 %] 96 % (06/04 0825) Last BM Date: 11/26/15 (pta)  Intake/Output from previous day: 06/03 0701 - 06/04 0700 In: 2733.7 [I.V.:2683.7; IV Piggyback:50] Out: 2520 [Urine:2150; Emesis/NG output:75; Drains:195; Blood:100] Intake/Output this shift: Total I/O In: 30 [NG/GT:30] Out: 915 [Urine:700; Emesis/NG output:75; Drains:140]  General appearance: Alert.  Pleasant.  Cooperative.  No distress. Resp: clear to auscultation bilaterally GI: Abdomen soft.  Appropriately tender.  Bandages clean and dry.  Drains with some bile tinged drainage.  Lab Results:  Results for orders placed or performed during the hospital encounter of 11/27/15 (from the past 24 hour(s))  CBC     Status: Abnormal   Collection Time: 11/29/15  4:37 AM  Result Value Ref Range   WBC 9.6 4.0 - 10.5 K/uL   RBC 4.33 3.87 - 5.11 MIL/uL   Hemoglobin 11.6 (L) 12.0 - 15.0 g/dL   HCT 16.137.5 09.636.0 - 04.546.0 %   MCV 86.6 78.0 - 100.0 fL   MCH 26.8 26.0 - 34.0 pg   MCHC 30.9 30.0 - 36.0 g/dL   RDW 40.914.3 81.111.5 - 91.415.5 %   Platelets 257 150 - 400 K/uL  Comprehensive metabolic panel     Status: Abnormal   Collection Time: 11/29/15  4:37 AM  Result Value Ref Range   Sodium 140 135 - 145 mmol/L   Potassium 4.7 3.5 - 5.1 mmol/L   Chloride 107 101 - 111 mmol/L   CO2 25 22 - 32 mmol/L   Glucose, Bld 102 (H) 65 - 99 mg/dL   BUN 5 (L) 6 - 20 mg/dL   Creatinine, Ser 7.820.87 0.44  - 1.00 mg/dL   Calcium 8.7 (L) 8.9 - 10.3 mg/dL   Total Protein 6.0 (L) 6.5 - 8.1 g/dL   Albumin 2.5 (L) 3.5 - 5.0 g/dL   AST 70 (H) 15 - 41 U/L   ALT 66 (H) 14 - 54 U/L   Alkaline Phosphatase 54 38 - 126 U/L   Total Bilirubin 0.4 0.3 - 1.2 mg/dL   GFR calc non Af Amer >60 >60 mL/min   GFR calc Af Amer >60 >60 mL/min   Anion gap 8 5 - 15     Studies/Results: Dg Cholangiogram Operative  11/28/2015  CLINICAL DATA:  55 year old female with a history of cholelithiasis/cholecystitis EXAM: INTRAOPERATIVE CHOLANGIOGRAM TECHNIQUE: Cholangiographic images from the C-arm fluoroscopic device were submitted for interpretation post-operatively. Please see the procedural report for the amount of contrast and the fluoroscopy time utilized. COMPARISON:  Ultrasound 11/27/2015 FINDINGS: Surgical instruments project over the upper abdomen. There is cannulation of the cystic duct/gallbladder neck, with antegrade infusion of contrast. Caliber of the extrahepatic ductal system within normal limits. No large filling defect identified. Small extraluminal contrast volume Free flow of contrast across the ampulla. IMPRESSION: Intraoperative cholangiogram demonstrates extrahepatic biliary ducts of unremarkable caliber, with no large filling defect identified. Free flow of contrast across the ampulla. Please refer  to the dictated operative report for full details of intraoperative findings and procedure Signed, Yvone Neu. Loreta Ave, DO [facility] Electronically Signed   By: Gilmer Mor D.O.   On: 11/28/2015 09:48    . cefTRIAXone (ROCEPHIN)  IV  2 g Intravenous Q24H  . enoxaparin (LOVENOX) injection  40 mg Subcutaneous Q24H  . famotidine (PEPCID) IV  20 mg Intravenous Q24H  . HYDROmorphone   Intravenous Q4H     Assessment/Plan: s/p Procedure(s): Laparoscopic converted to open cholecystectomy  POD #1.  Laparoscopic converted to open cholecystectomy, Roux-en-Y  choledocho jejunostomy for management of bile duct  Stable May have controlled bile leak Continue NG tube and drains Mobilize out of bed Incentive spirometry Lab tomorrow  @     Claud Kelp M 11/29/2015  . .prob

## 2015-11-30 ENCOUNTER — Encounter (HOSPITAL_COMMUNITY): Payer: Self-pay | Admitting: Surgery

## 2015-11-30 LAB — COMPREHENSIVE METABOLIC PANEL
ALBUMIN: 2.7 g/dL — AB (ref 3.5–5.0)
ALT: 47 U/L (ref 14–54)
AST: 43 U/L — AB (ref 15–41)
Alkaline Phosphatase: 57 U/L (ref 38–126)
Anion gap: 9 (ref 5–15)
BUN: 6 mg/dL (ref 6–20)
CHLORIDE: 104 mmol/L (ref 101–111)
CO2: 25 mmol/L (ref 22–32)
Calcium: 8.9 mg/dL (ref 8.9–10.3)
Creatinine, Ser: 0.85 mg/dL (ref 0.44–1.00)
GFR calc Af Amer: >60 mL/min (ref >60)
GFR calc non Af Amer: >60 mL/min (ref >60)
GLUCOSE: 97 mg/dL (ref 65–99)
POTASSIUM: 4 mmol/L (ref 3.5–5.1)
SODIUM: 138 mmol/L (ref 135–145)
Total Bilirubin: 0.7 mg/dL (ref 0.3–1.2)
Total Protein: 6.4 g/dL — ABNORMAL LOW (ref 6.5–8.1)

## 2015-11-30 LAB — CBC
HCT: 37.1 % (ref 36.0–46.0)
Hemoglobin: 11.4 g/dL — ABNORMAL LOW (ref 12.0–15.0)
MCH: 26.7 pg (ref 26.0–34.0)
MCHC: 30.7 g/dL (ref 30.0–36.0)
MCV: 86.9 fL (ref 78.0–100.0)
PLATELETS: 296 10*3/uL (ref 150–400)
RBC: 4.27 MIL/uL (ref 3.87–5.11)
RDW: 14.4 % (ref 11.5–15.5)
WBC: 9.1 10*3/uL (ref 4.0–10.5)

## 2015-11-30 NOTE — Progress Notes (Signed)
2 Days Post-Op  Subjective: In good spirits   Objective: Vital signs in last 24 hours: Temp:  [98 F (36.7 C)-98.8 F (37.1 C)] 98.6 F (37 C) (06/05 0629) Pulse Rate:  [73-84] 84 (06/05 0629) Resp:  [15-28] 17 (06/05 0818) BP: (115-124)/(69-74) 119/73 mmHg (06/05 0629) SpO2:  [91 %-97 %] 97 % (06/05 0818) Last BM Date: 11/26/15  Intake/Output from previous day: 06/04 0701 - 06/05 0700 In: 3330 [I.V.:3200; NG/GT:30; IV Piggyback:100] Out: 2745 [Urine:2000; Emesis/NG output:350; Drains:395] Intake/Output this shift:    Incision/Wound:dressings clean  Abdomen  Flat   Lab Results:   Recent Labs  11/29/15 0437 11/30/15 0538  WBC 9.6 9.1  HGB 11.6* 11.4*  HCT 37.5 37.1  PLT 257 296   BMET  Recent Labs  11/29/15 0437 11/30/15 0538  NA 140 138  K 4.7 4.0  CL 107 104  CO2 25 25  GLUCOSE 102* 97  BUN 5* 6  CREATININE 0.87 0.85  CALCIUM 8.7* 8.9   PT/INR No results for input(s): LABPROT, INR in the last 72 hours. ABG No results for input(s): PHART, HCO3 in the last 72 hours.  Invalid input(s): PCO2, PO2  Studies/Results: Dg Cholangiogram Operative  11/28/2015  CLINICAL DATA:  55 year old female with a history of cholelithiasis/cholecystitis EXAM: INTRAOPERATIVE CHOLANGIOGRAM TECHNIQUE: Cholangiographic images from the C-arm fluoroscopic device were submitted for interpretation post-operatively. Please see the procedural report for the amount of contrast and the fluoroscopy time utilized. COMPARISON:  Ultrasound 11/27/2015 FINDINGS: Surgical instruments project over the upper abdomen. There is cannulation of the cystic duct/gallbladder neck, with antegrade infusion of contrast. Caliber of the extrahepatic ductal system within normal limits. No large filling defect identified. Small extraluminal contrast volume Free flow of contrast across the ampulla. IMPRESSION: Intraoperative cholangiogram demonstrates extrahepatic biliary ducts of unremarkable caliber, with no large  filling defect identified. Free flow of contrast across the ampulla. Please refer to the dictated operative report for full details of intraoperative findings and procedure Signed, Yvone NeuJaime S. Loreta AveWagner, DO [facility] Electronically Signed   By: Gilmer MorJaime  Wagner D.O.   On: 11/28/2015 09:48    Anti-infectives: Anti-infectives    Start     Dose/Rate Route Frequency Ordered Stop   11/28/15 1445  cefTRIAXone (ROCEPHIN) 2 g in dextrose 5 % 50 mL IVPB     2 g 100 mL/hr over 30 Minutes Intravenous Every 24 hours 11/28/15 1431     11/27/15 1130  cefTRIAXone (ROCEPHIN) 2 g in dextrose 5 % 50 mL IVPB  Status:  Discontinued     2 g 100 mL/hr over 30 Minutes Intravenous Every 24 hours 11/27/15 1129 11/28/15 1431      Assessment/Plan: s/p Procedure(s): Laparoscopic converted to open cholecystectomy (N/A) CBD injury with reconstruction via Roux en Y hepaticojejunostomy  Looks well In good spirits Does have a bile leak  D/C NGT OOB ice chips ok   LOS: 1 day    [name] A. 11/30/2015

## 2015-12-01 LAB — CBC
HEMATOCRIT: 34.2 % — AB (ref 36.0–46.0)
HEMOGLOBIN: 10.6 g/dL — AB (ref 12.0–15.0)
MCH: 26.8 pg (ref 26.0–34.0)
MCHC: 31 g/dL (ref 30.0–36.0)
MCV: 86.6 fL (ref 78.0–100.0)
Platelets: 258 10*3/uL (ref 150–400)
RBC: 3.95 MIL/uL (ref 3.87–5.11)
RDW: 13.9 % (ref 11.5–15.5)
WBC: 7.4 10*3/uL (ref 4.0–10.5)

## 2015-12-01 LAB — COMPREHENSIVE METABOLIC PANEL
ALBUMIN: 2.5 g/dL — AB (ref 3.5–5.0)
ALT: 35 U/L (ref 14–54)
ANION GAP: 10 (ref 5–15)
AST: 32 U/L (ref 15–41)
Alkaline Phosphatase: 51 U/L (ref 38–126)
BUN: 5 mg/dL — AB (ref 6–20)
CHLORIDE: 105 mmol/L (ref 101–111)
CO2: 24 mmol/L (ref 22–32)
Calcium: 8.7 mg/dL — ABNORMAL LOW (ref 8.9–10.3)
Creatinine, Ser: 0.84 mg/dL (ref 0.44–1.00)
GFR calc Af Amer: >60 mL/min (ref >60)
GFR calc non Af Amer: >60 mL/min (ref >60)
GLUCOSE: 102 mg/dL — AB (ref 65–99)
POTASSIUM: 4.4 mmol/L (ref 3.5–5.1)
SODIUM: 139 mmol/L (ref 135–145)
Total Bilirubin: 0.7 mg/dL (ref 0.3–1.2)
Total Protein: 6.1 g/dL — ABNORMAL LOW (ref 6.5–8.1)

## 2015-12-01 MED ORDER — OXYCODONE HCL 5 MG PO TABS
5.0000 mg | ORAL_TABLET | ORAL | Status: DC | PRN
  Administered 2015-12-02 – 2015-12-03 (×3): 5 mg via ORAL
  Filled 2015-12-01 (×3): qty 1

## 2015-12-01 MED ORDER — HYDROMORPHONE HCL 1 MG/ML IJ SOLN
1.0000 mg | INTRAMUSCULAR | Status: DC | PRN
  Administered 2015-12-01 – 2015-12-02 (×5): 1 mg via INTRAVENOUS
  Filled 2015-12-01 (×5): qty 1

## 2015-12-01 MED ORDER — OXYCODONE-ACETAMINOPHEN 5-325 MG PO TABS
1.0000 | ORAL_TABLET | ORAL | Status: DC | PRN
  Administered 2015-12-01 – 2015-12-02 (×2): 2 via ORAL
  Administered 2015-12-02: 1 via ORAL
  Administered 2015-12-02 – 2015-12-03 (×2): 2 via ORAL
  Filled 2015-12-01 (×4): qty 2
  Filled 2015-12-01: qty 1

## 2015-12-01 NOTE — Progress Notes (Signed)
[facility] Progress Note  3 Days Post-Op  Subjective: Laying in bed, NAD. Husband in room. Ambulating well - 3 laps yesterday. Abdominal pain controlled, worse with coughing and afeter up ambulating. No flatus/BM. Belching. Urinating without hesitancy.   Patient requested to d/c PCA due to beeping and not wanting to be "attached to as much stuff".   2 drains with serosanguinous/bilious output: 12200ml/24 h  Objective: Vital signs in last 24 hours: Temp:  [97.6 F (36.4 C)-99.3 F (37.4 C)] 98.7 F (37.1 C) (06/06 0534) Pulse Rate:  [78-88] 88 (06/06 0534) Resp:  [20-30] 23 (06/06 0807) BP: (124-130)/(70-78) 125/75 mmHg (06/06 0534) SpO2:  [90 %-99 %] 92 % (06/06 0807) Last BM Date: 11/26/15  Intake/Output from previous day: 06/05 0701 - 06/06 0700 In: 2265 [I.V.:2265] Out: 2580 [Urine:2450; Emesis/NG output:30; Drains:100] Intake/Output this shift:    PE: Gen:  Alert, NAD, pleasant Card:  RRR, no M/G/R heard Pulm:  CTA, no W/R/R Abd: Soft, TTP, mildly distended, +BS, no HSM, incisions C/D/I, 2 JP drains in R abdomen drains with minimal bilious/serosanguinous drainage. Ext:  No erythema, edema, or tenderness   Lab Results:   Recent Labs  11/29/15 0437 11/30/15 0538  WBC 9.6 9.1  HGB 11.6* 11.4*  HCT 37.5 37.1  PLT 257 296   BMET  Recent Labs  11/30/15 0538 12/01/15 0020  NA 138 139  K 4.0 4.4  CL 104 105  CO2 25 24  GLUCOSE 97 102*  BUN 6 5*  CREATININE 0.85 0.84  CALCIUM 8.9 8.7*   PT/INR No results for input(s): LABPROT, INR in the last 72 hours. CMP     Component Value Date/Time   NA 139 12/01/2015 0020   K 4.4 12/01/2015 0020   CL 105 12/01/2015 0020   CO2 24 12/01/2015 0020   GLUCOSE 102* 12/01/2015 0020   BUN 5* 12/01/2015 0020   CREATININE 0.84 12/01/2015 0020   CALCIUM 8.7* 12/01/2015 0020   PROT 6.1* 12/01/2015 0020   ALBUMIN 2.5* 12/01/2015 0020   AST 32 12/01/2015 0020   ALT 35 12/01/2015 0020   ALKPHOS 51  12/01/2015 0020   BILITOT 0.7 12/01/2015 0020   GFRNONAA >60 12/01/2015 0020   GFRAA >60 12/01/2015 0020   Lipase     Component Value Date/Time   LIPASE 20 11/28/2015 0609    Studies/Results: No results found.  Anti-infectives: Anti-infectives    Start     Dose/Rate Route Frequency Ordered Stop   11/28/15 1445  cefTRIAXone (ROCEPHIN) 2 g in dextrose 5 % 50 mL IVPB     2 g 100 mL/hr over 30 Minutes Intravenous Every 24 hours 11/28/15 1431     11/27/15 1130  cefTRIAXone (ROCEPHIN) 2 g in dextrose 5 % 50 mL IVPB  Status:  Discontinued     2 g 100 mL/hr over 30 Minutes Intravenous Every 24 hours 11/27/15 1129 11/28/15 1431       Assessment/Plan Acute cholecystitis with CBD injury s/p Laparoscopic cholecystectomy with IOC converted to open cholecystectomy with Roux en Y hepaticojejunostomy (11/28/15 Dr.Tsuei)  - bile leak: 100 cc in 24 h; following  - d/ced PCA; Dilaudid PRN and Percocet PO  - ICS   FEN: start clears Dispo: floor, ambulate     LOS: 2 days    Tiffany PhenixElizabeth S Warren , [facility] LPA-C [facility] 12/01/2015, 8:23 AM Pager: [phone] Mon-Fri 7:00 am-4:30 pm Sat-Sun 7:00 am-11:30 am

## 2015-12-02 NOTE — Progress Notes (Signed)
[facility] Progress Note  4 Days Post-Op  Subjective:  Laying in bed, husband in room. Pt complains of abdominal pain with coughing, husband reports patient has had a chronic cough of unknown etiology for years, refractory to allergy medications. Outside of coughing, pain controlled. Ambulating well, multiple times daily. Tolerating clears. Pt states she "feels her stomach moving" but has not passed gas or had a BM. Pulled 1,000 on IS this AM.    24h output JP drains: 60 cc and serosanguinous/sanguinous; bile output has dramatically decreased  Objective: Vital signs in last 24 hours: Temp:  [98.4 F (36.9 C)-98.6 F (37 C)] 98.4 F (36.9 C) (06/07 0509) Pulse Rate:  [70-80] 70 (06/07 0509) Resp:  [19-23] 19 (06/07 0509) BP: (104-141)/(69-80) 104/69 mmHg (06/07 0509) SpO2:  [92 %-95 %] 95 % (06/07 0509) Last BM Date: 11/26/15  Intake/Output from previous day: 06/06 0701 - 06/07 0700 In: 1741.7 [P.O.:120; I.V.:1421.7; IV Piggyback:200] Out: 1810 [Urine:1750; Drains:60] Intake/Output this shift:    PE: Gen: Alert, NAD, pleasant Card: RRR, no M/G/R heard Pulm: CTA, no W/R/R Abd: Soft, TTP, mildly distended, +BS, no HSM, incisions C/D/I, 2 JP drains in R abdomen. Ext: No erythema, edema, or tenderness   Lab Results:   Recent Labs  11/30/15 0538 12/01/15 0945  WBC 9.1 7.4  HGB 11.4* 10.6*  HCT 37.1 34.2*  PLT 296 258   BMET  Recent Labs  11/30/15 0538 12/01/15 0020  NA 138 139  K 4.0 4.4  CL 104 105  CO2 25 24  GLUCOSE 97 102*  BUN 6 5*  CREATININE 0.85 0.84  CALCIUM 8.9 8.7*   PT/INR No results for input(s): LABPROT, INR in the last 72 hours. CMP     Component Value Date/Time   NA 139 12/01/2015 0020   K 4.4 12/01/2015 0020   CL 105 12/01/2015 0020   CO2 24 12/01/2015 0020   GLUCOSE 102* 12/01/2015 0020   BUN 5* 12/01/2015 0020   CREATININE 0.84 12/01/2015 0020   CALCIUM 8.7* 12/01/2015 0020   PROT 6.1* 12/01/2015 0020   ALBUMIN  2.5* 12/01/2015 0020   AST 32 12/01/2015 0020   ALT 35 12/01/2015 0020   ALKPHOS 51 12/01/2015 0020   BILITOT 0.7 12/01/2015 0020   GFRNONAA >60 12/01/2015 0020   GFRAA >60 12/01/2015 0020   Lipase     Component Value Date/Time   LIPASE 20 11/28/2015 0609    Studies/Results: No results found.  Anti-infectives: Anti-infectives    Start     Dose/Rate Route Frequency Ordered Stop   11/28/15 1445  cefTRIAXone (ROCEPHIN) 2 g in dextrose 5 % 50 mL IVPB     2 g 100 mL/hr over 30 Minutes Intravenous Every 24 hours 11/28/15 1431     11/27/15 1130  cefTRIAXone (ROCEPHIN) 2 g in dextrose 5 % 50 mL IVPB  Status:  Discontinued     2 g 100 mL/hr over 30 Minutes Intravenous Every 24 hours 11/27/15 1129 11/28/15 1431       Assessment/Plan Acute cholecystitis with CBD injury s/p Laparoscopic cholecystectomy with IOC converted to open cholecystectomy with Roux en Y hepaticojejunostomy (11/28/15 Dr.Tsuei) - bile leak: 60 cc in 24 h; following; likely discharge home with drains in place -  Rocephin day #5/7 - Dilaudid PRN and Percocet PO - ICS   FEN: advance to full liquids Dispo: floor, ambulate    LOS: 3 days    Adam Phenix , [facility] 12/02/2015, 7:23 AM  Pager: [phone] Mon-Fri 7:00 am-4:30 pm Sat-Sun 7:00 am-11:30 am

## 2015-12-03 MED ORDER — HYDROCODONE-ACETAMINOPHEN 5-325 MG PO TABS
1.0000 | ORAL_TABLET | ORAL | Status: DC | PRN
  Administered 2015-12-03 (×2): 2 via ORAL
  Filled 2015-12-03 (×2): qty 2

## 2015-12-03 MED ORDER — HYDROMORPHONE HCL 1 MG/ML IJ SOLN
0.5000 mg | INTRAMUSCULAR | Status: DC | PRN
  Administered 2015-12-03 – 2015-12-07 (×6): 1 mg via INTRAVENOUS
  Filled 2015-12-03 (×6): qty 1

## 2015-12-03 NOTE — Progress Notes (Signed)
[facility] Progress Note  5 Days Post-Op  Subjective: Patient in her room in NAD. Reports nausea yesterday without vomiting after full liquids at lunch time, unable to tolerate anything PO at dinner. Patient doesn't know whether to attribute nausea to food or PO pain meds. Ambulating well. Urinating without hesitancy. Having flatus. No BM.    Objective: Vital signs in last 24 hours: Temp:  [98.1 F (36.7 C)-98.5 F (36.9 C)] 98.5 F (36.9 C) (06/08 0631) Pulse Rate:  [64-67] 64 (06/08 0631) Resp:  [19] 19 (06/08 0631) BP: (122-131)/(72-79) 122/72 mmHg (06/08 0631) SpO2:  [99 %-100 %] 100 % (06/08 0631) Last BM Date: 11/26/15  Intake/Output from previous day: 06/07 0701 - 06/08 0700 In: 2805 [P.O.:480; I.V.:2325] Out: 1600 [Urine:1450; Drains:150] Intake/Output this shift:    PE: Gen:  Alert, NAD, pleasant Card:  RRR, no M/G/R heard Pulm:  CTA, no W/R/R Abd: Soft, appropriately tender, ND, +BS, laparotomy incision with staples c/d/i, removed dressings and applied clean ones. 2 JP drains in place with minimal sanguinous output, no bile. Ext:  No erythema, edema, or tenderness.   Lab Results:   Recent Labs  12/01/15 0945  WBC 7.4  HGB 10.6*  HCT 34.2*  PLT 258   BMET  Recent Labs  12/01/15 0020  NA 139  K 4.4  CL 105  CO2 24  GLUCOSE 102*  BUN 5*  CREATININE 0.84  CALCIUM 8.7*   PT/INR No results for input(s): LABPROT, INR in the last 72 hours. CMP     Component Value Date/Time   NA 139 12/01/2015 0020   K 4.4 12/01/2015 0020   CL 105 12/01/2015 0020   CO2 24 12/01/2015 0020   GLUCOSE 102* 12/01/2015 0020   BUN 5* 12/01/2015 0020   CREATININE 0.84 12/01/2015 0020   CALCIUM 8.7* 12/01/2015 0020   PROT 6.1* 12/01/2015 0020   ALBUMIN 2.5* 12/01/2015 0020   AST 32 12/01/2015 0020   ALT 35 12/01/2015 0020   ALKPHOS 51 12/01/2015 0020   BILITOT 0.7 12/01/2015 0020   GFRNONAA >60 12/01/2015 0020   GFRAA >60 12/01/2015 0020   Lipase     Component Value Date/Time   LIPASE 20 11/28/2015 0609    Studies/Results: No results found.  Anti-infectives: Anti-infectives    Start     Dose/Rate Route Frequency Ordered Stop   11/28/15 1445  cefTRIAXone (ROCEPHIN) 2 g in dextrose 5 % 50 mL IVPB     2 g 100 mL/hr over 30 Minutes Intravenous Every 24 hours 11/28/15 1431     11/27/15 1130  cefTRIAXone (ROCEPHIN) 2 g in dextrose 5 % 50 mL IVPB  Status:  Discontinued     2 g 100 mL/hr over 30 Minutes Intravenous Every 24 hours 11/27/15 1129 11/28/15 1431     Assessment/Plan Acute cholecystitis with CBD injury s/p Laparoscopic cholecystectomy with IOC converted to open cholecystectomy with Roux en Y hepaticojejunostomy (11/28/15 Dr.Tsuei) - bile leak: 150 cc in 24 h; following; likely discharge home with drains in place - Rocephin day #6/7 - switch from percocet to NORCO/VICODIN; Dilaudid for breakthrough only. - ICS   FEN: back diet off to clears this AM, can advance as tolerated after lunch if nausea is controlled. Dispo: floor, ambulate    LOS: 4 days    Adam PhenixElizabeth S Simaan , [facility] 12/03/2015, 9:44 AM Pager: [phone] Mon-Fri 7:00 am-4:30 pm Sat-Sun 7:00 am-11:30 am

## 2015-12-04 ENCOUNTER — Inpatient Hospital Stay (HOSPITAL_COMMUNITY)

## 2015-12-04 LAB — COMPREHENSIVE METABOLIC PANEL
ALBUMIN: 2.4 g/dL — AB (ref 3.5–5.0)
ALT: 28 U/L (ref 14–54)
AST: 24 U/L (ref 15–41)
Alkaline Phosphatase: 55 U/L (ref 38–126)
Anion gap: 7 (ref 5–15)
CHLORIDE: 105 mmol/L (ref 101–111)
CO2: 28 mmol/L (ref 22–32)
CREATININE: 0.64 mg/dL (ref 0.44–1.00)
Calcium: 8.9 mg/dL (ref 8.9–10.3)
GFR calc Af Amer: >60 mL/min (ref >60)
GLUCOSE: 120 mg/dL — AB (ref 65–99)
POTASSIUM: 4 mmol/L (ref 3.5–5.1)
SODIUM: 140 mmol/L (ref 135–145)
Total Bilirubin: 0.1 mg/dL — ABNORMAL LOW (ref 0.3–1.2)
Total Protein: 5.7 g/dL — ABNORMAL LOW (ref 6.5–8.1)

## 2015-12-04 MED ORDER — TECHNETIUM TC 99M MEBROFENIN IV KIT
5.5000 | PACK | Freq: Once | INTRAVENOUS | Status: AC | PRN
  Administered 2015-12-04: 6 via INTRAVENOUS

## 2015-12-04 MED ORDER — OXYCODONE-ACETAMINOPHEN 5-325 MG PO TABS
1.0000 | ORAL_TABLET | ORAL | Status: DC | PRN
  Administered 2015-12-04 – 2015-12-09 (×18): 2 via ORAL
  Filled 2015-12-04 (×19): qty 2

## 2015-12-04 NOTE — Progress Notes (Signed)
[facility] Progress Note  6 Days Post-Op  Subjective: Laying in bed. Husband in room. Concerned about increase in bilious output in JP drains overnight. Also complains of mild epigastric pain that is non-radiating.Tolerating regular diet. Having flatus. No BM. Urinating without hesitancy. Ambulating well. Pain controlled.   24h drain OP: 235 cc **  Drain #2 (more central)  > 150 mL bilious output in less than 24h Drain #1 (more lateral/RUQ) - serosanguinous drainage   Objective: Vital signs in last 24 hours: Temp:  [98.6 F (37 C)-99.1 F (37.3 C)] 98.6 F (37 C) (06/09 0610) Pulse Rate:  [62-65] 62 (06/09 0610) Resp:  [18-19] 19 (06/09 0610) BP: (123-147)/(68-82) 123/71 mmHg (06/09 0610) SpO2:  [99 %-100 %] 99 % (06/09 0610) Last BM Date: 11/26/15  Intake/Output from previous day: 06/08 0701 - 06/09 0700 In: 2203 [P.O.:360; I.V.:1843] Out: 2810 [Urine:2575; Drains:235] Intake/Output this shift:    PE: Gen:  Alert, NAD, pleasant Card:  RRR, no M/G/R heard Pulm:  CTA, no W/R/R Abd: Soft, appropriately tender, ND, +BS, no HSM, two R JP drains in place, drain #2 with green bilious output, #1 with sanguinous drainage. Ext:  No erythema, edema, or tenderness  Lab Results:  No results for input(s): WBC, HGB, HCT, PLT in the last 72 hours. BMET No results for input(s): NA, K, CL, CO2, GLUCOSE, BUN, CREATININE, CALCIUM in the last 72 hours. PT/INR No results for input(s): LABPROT, INR in the last 72 hours. CMP     Component Value Date/Time   NA 139 12/01/2015 0020   K 4.4 12/01/2015 0020   CL 105 12/01/2015 0020   CO2 24 12/01/2015 0020   GLUCOSE 102* 12/01/2015 0020   BUN 5* 12/01/2015 0020   CREATININE 0.84 12/01/2015 0020   CALCIUM 8.7* 12/01/2015 0020   PROT 6.1* 12/01/2015 0020   ALBUMIN 2.5* 12/01/2015 0020   AST 32 12/01/2015 0020   ALT 35 12/01/2015 0020   ALKPHOS 51 12/01/2015 0020   BILITOT 0.7 12/01/2015 0020   GFRNONAA >60 12/01/2015 0020    GFRAA >60 12/01/2015 0020   Lipase     Component Value Date/Time   LIPASE 20 11/28/2015 0609    Studies/Results: No results found.  Anti-infectives: Anti-infectives    Start     Dose/Rate Route Frequency Ordered Stop   11/28/15 1445  cefTRIAXone (ROCEPHIN) 2 g in dextrose 5 % 50 mL IVPB     2 g 100 mL/hr over 30 Minutes Intravenous Every 24 hours 11/28/15 1431     11/27/15 1130  cefTRIAXone (ROCEPHIN) 2 g in dextrose 5 % 50 mL IVPB  Status:  Discontinued     2 g 100 mL/hr over 30 Minutes Intravenous Every 24 hours 11/27/15 1129 11/28/15 1431      Assessment/Plan Acute cholecystitis with CBD injury s/p Laparoscopic cholecystectomy with IOC converted to open cholecystectomy with Roux en Y hepaticojejunostomy (11/28/15 Dr.Tsuei) - increase in bilious drainage overnight after 48 h serosanguinous drainage - made NPO, checked LFT's; will discuss need for HIDA scan with Dr. Luisa Hartornett. - Rocephin day #7/10 - Percocet; Dilaudid for breakthrough only. - ICS, ambulate  FEN: NPO. Dispo: floor, ambulate    LOS: 5 days    Adam PhenixElizabeth S Simaan , [facility] LPA-C [facility] 12/04/2015, 10:59 AM Pager: [phone] Mon-Fri 7:00 am-4:30 pm Sat-Sun 7:00 am-11:30 am

## 2015-12-05 LAB — CREATININE, SERUM: CREATININE: 0.73 mg/dL (ref 0.44–1.00)

## 2015-12-05 MED ORDER — ENSURE ENLIVE PO LIQD
237.0000 mL | Freq: Two times a day (BID) | ORAL | Status: DC
  Administered 2015-12-06 – 2015-12-09 (×4): 237 mL via ORAL

## 2015-12-05 NOTE — Progress Notes (Signed)
[facility] Progress Note  7 Days Post-Op  Subjective: Laying in bed. Husband in room. Concerned about increase in bilious output in JP drains overnight. Also complains of mild epigastric pain that is non-radiating.Tolerating regular diet. Having flatus. No BM. Urinating without hesitancy. Ambulating well. Pain controlled.   Bilious drain OP: 225 cc    Objective: Vital signs in last 24 hours: Temp:  [97.8 F (36.6 C)-98.9 F (37.2 C)] 98.6 F (37 C) (06/10 0530) Pulse Rate:  [61-73] 61 (06/10 0530) Resp:  [17-18] 18 (06/10 0530) BP: (117-152)/(68-77) 117/68 mmHg (06/10 0530) SpO2:  [100 %] 100 % (06/10 0530) Last BM Date: 11/26/15  Intake/Output from previous day: 06/09 0701 - 06/10 0700 In: 2262 [P.O.:440; I.V.:1822] Out: 2395 [Urine:2125; Drains:270] Intake/Output this shift: Total I/O In: 60 [P.O.:60] Out: 80 [Drains:80]  PE: Gen:  Alert, NAD, pleasant Abd: Soft, appropriately tender, ND, two R JP drains in place, drain #2 with green bilious output, #1 with sanguinous drainage. Ext:  No erythema, edema, or tenderness  Lab Results:  No results for input(s): WBC, HGB, HCT, PLT in the last 72 hours. BMET  Recent Labs  12/04/15 1148 12/05/15 0443  NA 140  --   K 4.0  --   CL 105  --   CO2 28  --   GLUCOSE 120*  --   BUN <5*  --   CREATININE 0.64 0.73  CALCIUM 8.9  --    PT/INR No results for input(s): LABPROT, INR in the last 72 hours. CMP     Component Value Date/Time   NA 140 12/04/2015 1148   K 4.0 12/04/2015 1148   CL 105 12/04/2015 1148   CO2 28 12/04/2015 1148   GLUCOSE 120* 12/04/2015 1148   BUN <5* 12/04/2015 1148   CREATININE 0.73 12/05/2015 0443   CALCIUM 8.9 12/04/2015 1148   PROT 5.7* 12/04/2015 1148   ALBUMIN 2.4* 12/04/2015 1148   AST 24 12/04/2015 1148   ALT 28 12/04/2015 1148   ALKPHOS 55 12/04/2015 1148   BILITOT 0.1* 12/04/2015 1148   GFRNONAA >60 12/05/2015 0443   GFRAA >60 12/05/2015 0443   Lipase      Component Value Date/Time   LIPASE 20 11/28/2015 0609    Studies/Results: Nm Hepatobiliary Liver Func  12/04/2015  CLINICAL DATA:  Evaluate for bile leak.  Cholecystectomy 6 days ago EXAM: NUCLEAR MEDICINE HEPATOBILIARY IMAGING TECHNIQUE: Sequential images of the abdomen were obtained out to 60 minutes following intravenous administration of radiopharmaceutical. RADIOPHARMACEUTICALS:  5.5 mCi Tc-19m  Choletec IV COMPARISON:  Ultrasound of 11/27/2015. FINDINGS: Prompt uptake of tracer by the liver. There is prompt activity in the porta hepatis which is likely in the common duct. Bowel activity is seen by approximately 10 minutes. presumed gastric reflux of tracer beginning at approximately at the 17 minute mark. During the second hour of imaging , there is passage of tracer more distally in the bowel. No persistent tracer in the region of the operative bed. IMPRESSION: No evidence of bile leak. Electronically Signed   By: Jeronimo Greaves M.D.   On: 12/04/2015 19:25    Anti-infectives: Anti-infectives    Start     Dose/Rate Route Frequency Ordered Stop   11/28/15 1445  cefTRIAXone (ROCEPHIN) 2 g in dextrose 5 % 50 mL IVPB     2 g 100 mL/hr over 30 Minutes Intravenous Every 24 hours 11/28/15 1431     11/27/15 1130  cefTRIAXone (ROCEPHIN) 2 g in dextrose 5 % 50 mL  IVPB  Status:  Discontinued     2 g 100 mL/hr over 30 Minutes Intravenous Every 24 hours 11/27/15 1129 11/28/15 1431      Assessment/Plan Acute cholecystitis with CBD injury s/p Laparoscopic cholecystectomy with IOC converted to open cholecystectomy with Roux en Y hepaticojejunostomy (11/28/15 Dr.Tsuei) - Bile leak, controlled via JP - Rocephin day #8/10 - Percocet; Dilaudid for breakthrough only. - ICS, ambulate - UOP good, will decrease IVF's  FEN: reg diet Dispo: home with HH when stable medically    LOS: 6 days   Vanita PandaAlicia C Kooper Godshall, MD  Colorectal and General Surgery [facility]

## 2015-12-06 MED ORDER — FAMOTIDINE 20 MG PO TABS
20.0000 mg | ORAL_TABLET | Freq: Every day | ORAL | Status: DC
  Administered 2015-12-06 – 2015-12-09 (×4): 20 mg via ORAL
  Filled 2015-12-06 (×4): qty 1

## 2015-12-06 NOTE — Progress Notes (Signed)
Offered pt. A bath but Pt. Stated she will wash up later on today , I stated to pt if she needs help to please call for any assistance

## 2015-12-06 NOTE — Progress Notes (Signed)
[facility] Progress Note  8 Days Post-Op  Subjective: Lying in bed. Husband in room.Tolerating regular diet. Having flatus. No BM. Urinating without hesitancy. Ambulating well. Pain controlled.   Bilious drain OP: 425 cc    Objective: Vital signs in last 24 hours: Temp:  [98 F (36.7 C)-98.6 F (37 C)] 98.4 F (36.9 C) (06/11 0519) Pulse Rate:  [62-69] 62 (06/11 0519) Resp:  [17-18] 18 (06/11 0519) BP: (123-127)/(75-81) 125/75 mmHg (06/11 0519) SpO2:  [98 %-100 %] 99 % (06/11 0519) Last BM Date: 11/26/15  Intake/Output from previous day: 06/10 0701 - 06/11 0700 In: 2044 [P.O.:920; I.V.:1124] Out: 2700 [Urine:2200; Drains:500] Intake/Output this shift: Total I/O In: -  Out: 80 [Drains:80]  PE: Gen:  Alert, NAD, pleasant Abd: Soft, appropriately tender, ND, two R JP drains in place, drain #2 with green bilious output, #1 with sanguinous drainage. Ext:  No erythema, edema, or tenderness  Lab Results:  No results for input(s): WBC, HGB, HCT, PLT in the last 72 hours. BMET  Recent Labs  12/04/15 1148 12/05/15 0443  NA 140  --   K 4.0  --   CL 105  --   CO2 28  --   GLUCOSE 120*  --   BUN <5*  --   CREATININE 0.64 0.73  CALCIUM 8.9  --    PT/INR No results for input(s): LABPROT, INR in the last 72 hours. CMP     Component Value Date/Time   NA 140 12/04/2015 1148   K 4.0 12/04/2015 1148   CL 105 12/04/2015 1148   CO2 28 12/04/2015 1148   GLUCOSE 120* 12/04/2015 1148   BUN <5* 12/04/2015 1148   CREATININE 0.73 12/05/2015 0443   CALCIUM 8.9 12/04/2015 1148   PROT 5.7* 12/04/2015 1148   ALBUMIN 2.4* 12/04/2015 1148   AST 24 12/04/2015 1148   ALT 28 12/04/2015 1148   ALKPHOS 55 12/04/2015 1148   BILITOT 0.1* 12/04/2015 1148   GFRNONAA >60 12/05/2015 0443   GFRAA >60 12/05/2015 0443   Lipase     Component Value Date/Time   LIPASE 20 11/28/2015 0609    Studies/Results: Nm Hepatobiliary Liver Func  12/04/2015  CLINICAL DATA:  Evaluate  for bile leak.  Cholecystectomy 6 days ago EXAM: NUCLEAR MEDICINE HEPATOBILIARY IMAGING TECHNIQUE: Sequential images of the abdomen were obtained out to 60 minutes following intravenous administration of radiopharmaceutical. RADIOPHARMACEUTICALS:  5.5 mCi Tc-749m  Choletec IV COMPARISON:  Ultrasound of 11/27/2015. FINDINGS: Prompt uptake of tracer by the liver. There is prompt activity in the porta hepatis which is likely in the common duct. Bowel activity is seen by approximately 10 minutes. presumed gastric reflux of tracer beginning at approximately at the 17 minute mark. During the second hour of imaging , there is passage of tracer more distally in the bowel. No persistent tracer in the region of the operative bed. IMPRESSION: No evidence of bile leak. Electronically Signed   By: Jeronimo GreavesKyle  Talbot M.D.   On: 12/04/2015 19:25    Anti-infectives: Anti-infectives    Start     Dose/Rate Route Frequency Ordered Stop   11/28/15 1445  cefTRIAXone (ROCEPHIN) 2 g in dextrose 5 % 50 mL IVPB     2 g 100 mL/hr over 30 Minutes Intravenous Every 24 hours 11/28/15 1431     11/27/15 1130  cefTRIAXone (ROCEPHIN) 2 g in dextrose 5 % 50 mL IVPB  Status:  Discontinued     2 g 100 mL/hr over 30 Minutes Intravenous Every  24 hours 11/27/15 1129 11/28/15 1431      Assessment/Plan Acute cholecystitis with CBD injury s/p Laparoscopic cholecystectomy with IOC converted to open cholecystectomy with Roux en Y hepaticojejunostomy (11/28/15 Dr.Tsuei) - Bile leak, controlled via JP - Rocephin day #9/10 - Percocet; Dilaudid for breakthrough only. - ICS, ambulate - UOP good, will KVO IVF's  FEN: reg diet Dispo: home with HH when stable medically    LOS: 7 days   Vanita Panda, MD  Colorectal and General Surgery [facility]

## 2015-12-07 MED ORDER — OCTREOTIDE ACETATE 100 MCG/ML IJ SOLN
100.0000 ug | Freq: Three times a day (TID) | INTRAMUSCULAR | Status: DC
Start: 2015-12-07 — End: 2015-12-08
  Administered 2015-12-07: 100 ug via INTRAVENOUS
  Filled 2015-12-07 (×3): qty 1

## 2015-12-07 MED ORDER — POLYETHYLENE GLYCOL 3350 17 G PO PACK
17.0000 g | PACK | Freq: Every day | ORAL | Status: DC
  Administered 2015-12-07 – 2015-12-09 (×3): 17 g via ORAL
  Filled 2015-12-07 (×3): qty 1

## 2015-12-07 MED ORDER — DOCUSATE SODIUM 100 MG PO CAPS
100.0000 mg | ORAL_CAPSULE | Freq: Two times a day (BID) | ORAL | Status: DC
  Administered 2015-12-07 – 2015-12-09 (×5): 100 mg via ORAL
  Filled 2015-12-07 (×5): qty 1

## 2015-12-07 NOTE — Progress Notes (Signed)
Patient ID: Tiffany Warren, female   DOB: [birth year], [age] y.o.   MRN: [id number]     [facility]      [address]   [place], [place] [id number]    Phone: [phone] FAX: [phone]     Subjective: C/o pain at night ruq.  No n/v. Tolerating POs. Voiding.  Passing flatus, but no BM.   Objective:  Vital signs:  Filed Vitals:   12/06/15 0519 12/06/15 1433 12/06/15 2136 12/07/15 0622  BP: 125/75 121/77 113/65 106/59  Pulse: 62 59 67 69  Temp: 98.4 F (36.9 C) 97.8 F (36.6 C) 98.2 F (36.8 C) 98.6 F (37 C)  TempSrc: Oral Oral Oral   Resp: 18 18 18 18   Height:      Weight:      SpO2: 99% 100% 100% 97%    Last BM Date: 11/26/15  Intake/Output   Yesterday:  06/11 0701 - 06/12 0700 In: 490 [P.O.:240; IV Piggyback:250] Out: 2185 [Urine:1650; Drains:535] This shift:  Total I/O In: -  Out: 40 [Drains:40]   Physical Exam: General: Pt awake/alert/oriented x4 in no acute distress Chest: cta.  No chest wall pain w good excursion CV:  Pulses intact.  Regular rhythm Abdomen: Soft.  Nondistended.   Mildly tender at incisions only.  ruq drain with green bilious output. No evidence of peritonitis.  No incarcerated hernias. Ext:  SCDs BLE.  No mjr edema.  No cyanosis Skin: No petechiae / purpura   Problem List:   Active Problems:   Acute calculous cholecystitis    Results:   Labs: No results found for this or any previous visit (from the past 48 hour(s)).  Imaging / Studies: No results found.  Medications / Allergies:  Scheduled Meds: . cefTRIAXone (ROCEPHIN)  IV  2 g Intravenous Q24H  . enoxaparin (LOVENOX) injection  40 mg Subcutaneous Q24H  . famotidine  20 mg Oral Daily  . feeding supplement (ENSURE ENLIVE)  237 mL Oral BID BM   Continuous Infusions: . 0.9 % NaCl with KCl 20 mEq / L 10 mL/hr at 12/06/15 1103   PRN Meds:.hydrALAZINE, HYDROmorphone (DILAUDID) injection, naloxone **AND** sodium chloride flush,  ondansetron (ZOFRAN) IV, ondansetron **OR** ondansetron (ZOFRAN) IV, oxyCODONE-acetaminophen  Antibiotics: Anti-infectives    Start     Dose/Rate Route Frequency Ordered Stop   11/28/15 1445  cefTRIAXone (ROCEPHIN) 2 g in dextrose 5 % 50 mL IVPB     2 g 100 mL/hr over 30 Minutes Intravenous Every 24 hours 11/28/15 1431     11/27/15 1130  cefTRIAXone (ROCEPHIN) 2 g in dextrose 5 % 50 mL IVPB  Status:  Discontinued     2 g 100 mL/hr over 30 Minutes Intravenous Every 24 hours 11/27/15 1129 11/28/15 1431       Assessment/Plan Acute cholecystitis with CBD injury POD#9 s/p Laparoscopic cholecystectomy with IOC converted to open cholecystectomy with Roux en Y hepaticojejunostomy (11/28/15 Dr.Tsuei) -Bile leak, controlled via JP -Rocephin day #10/10 -Percocet; Dilaudid for breakthrough only. -CS, ambulate -add miralax/colace -remove staples tomorrow  FEN: reg diet Dispo: output has increased, but leak is controlled.  Discharge when output has stabilized or decreased    Ashok NorrisEmina Mairely Foxworth, [facility] Pager [phone](7A-4:30P) For consults and floor pages call [phone](7A-4:30P)   12/07/2015 7:56 AM

## 2015-12-07 NOTE — Progress Notes (Signed)
Patient walked a total of 28000ft this afternoon.

## 2015-12-07 NOTE — Progress Notes (Signed)
Patient had an episode of emesis- large amount. Not time for PRN Zofran. PA and MD paged. Awaiting call back.

## 2015-12-08 ENCOUNTER — Inpatient Hospital Stay (HOSPITAL_COMMUNITY)

## 2015-12-08 MED ORDER — METHOCARBAMOL 750 MG PO TABS: 750.0000 mg | ORAL_TABLET | Freq: Four times a day (QID) | ORAL | Status: DC | PRN

## 2015-12-08 MED ORDER — BISACODYL 10 MG RE SUPP
10.0000 mg | Freq: Once | RECTAL | Status: AC
  Administered 2015-12-08: 10 mg via RECTAL
  Filled 2015-12-08: qty 1

## 2015-12-08 MED ORDER — GADOBENATE DIMEGLUMINE 529 MG/ML IV SOLN
15.0000 mL | Freq: Once | INTRAVENOUS | Status: AC | PRN
  Administered 2015-12-08: 15 mL via INTRAVENOUS

## 2015-12-08 NOTE — Progress Notes (Signed)
Patient ID: Tiffany Warren, female   DOB: April 25, 1961, 55 y.o.   MRN: [id number]     [facility]      [address]   [place], [place] [address]    Phone: [phone] FAX: [phone]     Subjective: Vomiting with sandostatin, otherwise no n/v.  drain output. Up walking.   Objective:  Vital signs:  Filed Vitals:   12/07/15 0622 12/07/15 1350 12/07/15 2205 12/31/15 0516  BP: 106/59 115/69 132/66 99/55  Pulse: 69 69 64 71  Temp: 98.6 F (37 C) 98.5 F (36.9 C) 99.2 F (37.3 C) 99 F (37.2 C)  TempSrc:  Oral Oral Oral  Resp: 18 18 18 18   Height:      Weight:      SpO2: 97% 98% 100% 100%    Last BM Date: 11/26/15  Intake/Output   Yesterday:  06/12 0701 - 2022-12-31 0700 In: 240 [P.O.:240] Out: 1485 [Urine:1100; Drains:385] This shift:  Physical Exam: General: Pt awake/alert/oriented x4 in no acute distress Chest: cta. No chest wall pain w good excursion CV: Pulses intact. Regular rhythm Abdomen: Soft. Nondistended. Mildly tender at incisions only. ruq drain with green bilious output. No evidence of peritonitis. No incarcerated hernias. Ext: SCDs BLE. No mjr edema. No cyanosis Skin: No petechiae / purpura   Problem List:   Active Problems:   Acute calculous cholecystitis    Results:   Labs: No results found for this or any previous visit (from the past 48 hour(s)).  Imaging / Studies: Mr 3d Recon At Scanner  Dec 31, 2015  CLINICAL DATA:  55 year old female status post laparoscopic cholecystectomy with high OC, converted to open cholecystectomy and Roux-en-Y hepaticojejunostomy on 11/28/2015. Evaluate for bile leak. EXAM: MRI ABDOMEN WITHOUT AND WITH CONTRAST (INCLUDING MRCP) TECHNIQUE: Multiplanar multisequence MR imaging of the abdomen was performed both before and after the administration of intravenous contrast. Heavily T2-weighted images of the biliary and pancreatic ducts were obtained, and  three-dimensional MRCP images were rendered by post processing. CONTRAST:  15mL MULTIHANCE GADOBENATE DIMEGLUMINE 529 MG/ML IV SOLN COMPARISON:  No prior abdominal MRI. Abdominal ultrasound 11/27/2015. Nuclear medicine hepatobiliary scan 12/04/2015. FINDINGS: Lower chest: Linear areas of dependent signal intensity in the lung bases bilaterally, presumably areas of mild postoperative subsegmental atelectasis. Hepatobiliary: In segment 2 of the liver and in the caudate lobe there are 5 mm T1 hypointense, T2 hyperintense, nonenhancing lesions, compatible with tiny simple cysts. No other larger cystic or solid hepatic lesions. MRCP images demonstrate no intra or extrahepatic biliary ductal dilatation. Status post cholecystectomy. No unexpected postoperative fluid collections are noted in the gallbladder fossa to suggest the presence of a biloma or bile leak. A surgical drain is seen in the right upper quadrant extending into the region of the gallbladder fossa, with tip terminating beneath the left lobe of the liver. Pancreas: No pancreatic mass. No pancreatic ductal dilatation noted on MRCP images. No significant amount of pancreatic or peripancreatic fluid or inflammatory changes. Spleen: Unremarkable. Adrenals/Urinary Tract: Bilateral adrenal glands and bilateral kidneys are normal in appearance. No hydroureteronephrosis in the visualized abdomen. Stomach/Bowel: Postoperative changes of reported Roux-en-Y hepaticojejunostomy are difficult to discern on today's MRI examination. Vascular/Lymphatic: No aneurysm identified in the visualized abdominal vasculature. No lymphadenopathy noted in the abdomen. Other: No significant volume of ascites in the visualized peritoneal cavity. Healing midline abdominal surgical wound. Musculoskeletal: No aggressive osseous lesions are noted in the visualized portions of the skeleton. IMPRESSION: 1. Postoperative changes of recent cholecystectomy  and Roux-en-Y hepaticojejunostomy,  without abnormal postoperative fluid collection to suggest biliary leak or biloma. 2. No intra or extrahepatic biliary ductal dilatation. 3. Additional incidental findings, as above. Electronically Signed   By: Trudie Reedaniel  Entrikin M.D.   On: 12/08/2015 08:08   Mr Abd W/wo Cm/mrcp  12/08/2015  CLINICAL DATA:  55 year old female status post laparoscopic cholecystectomy with high OC, converted to open cholecystectomy and Roux-en-Y hepaticojejunostomy on 11/28/2015. Evaluate for bile leak. EXAM: MRI ABDOMEN WITHOUT AND WITH CONTRAST (INCLUDING MRCP) TECHNIQUE: Multiplanar multisequence MR imaging of the abdomen was performed both before and after the administration of intravenous contrast. Heavily T2-weighted images of the biliary and pancreatic ducts were obtained, and three-dimensional MRCP images were rendered by post processing. CONTRAST:  15mL MULTIHANCE GADOBENATE DIMEGLUMINE 529 MG/ML IV SOLN COMPARISON:  No prior abdominal MRI. Abdominal ultrasound 11/27/2015. Nuclear medicine hepatobiliary scan 12/04/2015. FINDINGS: Lower chest: Linear areas of dependent signal intensity in the lung bases bilaterally, presumably areas of mild postoperative subsegmental atelectasis. Hepatobiliary: In segment 2 of the liver and in the caudate lobe there are 5 mm T1 hypointense, T2 hyperintense, nonenhancing lesions, compatible with tiny simple cysts. No other larger cystic or solid hepatic lesions. MRCP images demonstrate no intra or extrahepatic biliary ductal dilatation. Status post cholecystectomy. No unexpected postoperative fluid collections are noted in the gallbladder fossa to suggest the presence of a biloma or bile leak. A surgical drain is seen in the right upper quadrant extending into the region of the gallbladder fossa, with tip terminating beneath the left lobe of the liver. Pancreas: No pancreatic mass. No pancreatic ductal dilatation noted on MRCP images. No significant amount of pancreatic or peripancreatic fluid  or inflammatory changes. Spleen: Unremarkable. Adrenals/Urinary Tract: Bilateral adrenal glands and bilateral kidneys are normal in appearance. No hydroureteronephrosis in the visualized abdomen. Stomach/Bowel: Postoperative changes of reported Roux-en-Y hepaticojejunostomy are difficult to discern on today's MRI examination. Vascular/Lymphatic: No aneurysm identified in the visualized abdominal vasculature. No lymphadenopathy noted in the abdomen. Other: No significant volume of ascites in the visualized peritoneal cavity. Healing midline abdominal surgical wound. Musculoskeletal: No aggressive osseous lesions are noted in the visualized portions of the skeleton. IMPRESSION: 1. Postoperative changes of recent cholecystectomy and Roux-en-Y hepaticojejunostomy, without abnormal postoperative fluid collection to suggest biliary leak or biloma. 2. No intra or extrahepatic biliary ductal dilatation. 3. Additional incidental findings, as above. Electronically Signed   By: Trudie Reedaniel  Entrikin M.D.   On: 12/08/2015 08:08    Medications / Allergies:  Scheduled Meds: . cefTRIAXone (ROCEPHIN)  IV  2 g Intravenous Q24H  . docusate sodium  100 mg Oral BID  . enoxaparin (LOVENOX) injection  40 mg Subcutaneous Q24H  . famotidine  20 mg Oral Daily  . feeding supplement (ENSURE ENLIVE)  237 mL Oral BID BM  . octreotide  100 mcg Intravenous TID  . polyethylene glycol  17 g Oral Daily   Continuous Infusions: . 0.9 % NaCl with KCl 20 mEq / L 10 mL/hr at 12/06/15 1103   PRN Meds:.hydrALAZINE, HYDROmorphone (DILAUDID) injection, naloxone **AND** sodium chloride flush, ondansetron (ZOFRAN) IV, ondansetron **OR** ondansetron (ZOFRAN) IV, oxyCODONE-acetaminophen  Antibiotics: Anti-infectives    Start     Dose/Rate Route Frequency Ordered Stop   11/28/15 1445  cefTRIAXone (ROCEPHIN) 2 g in dextrose 5 % 50 mL IVPB     2 g 100 mL/hr over 30 Minutes Intravenous Every 24 hours 11/28/15 1431     11/27/15 1130  cefTRIAXone  (ROCEPHIN) 2 g in dextrose 5 %  50 mL IVPB  Status:  Discontinued     2 g 100 mL/hr over 30 Minutes Intravenous Every 24 hours 11/27/15 1129 11/28/15 1431       Assessment/Plan Acute cholecystitis with CBD injury POD#10 s/p Laparoscopic cholecystectomy with IOC converted to open cholecystectomy with Roux en Y hepaticojejunostomy (11/28/15 Dr.Tsuei) -Bile leak, controlled via JP -Percocet; Dilaudid for breakthrough only. -IS, ambulate -miralax/colace, give dulcolax -remove staples today -DC sandostatin, pt refuses, vomited as soon as dose was started FEN: reg diet Dispo: watch for another 24h, then home     Ashok Norris, [facility] Pager [phone](7A-4:30P) For consults and floor pages call [phone](7A-4:30P)  12/08/2015 9:17 AM

## 2015-12-09 MED ORDER — OCTREOTIDE ACETATE 100 MCG/ML IJ SOLN
100.0000 ug | Freq: Once | INTRAMUSCULAR | Status: AC
  Administered 2015-12-09: 100 ug via SUBCUTANEOUS
  Filled 2015-12-09: qty 1

## 2015-12-09 MED ORDER — OCTREOTIDE ACETATE 100 MCG/ML IJ SOLN
100.0000 ug | Freq: Three times a day (TID) | INTRAMUSCULAR | Status: DC
  Filled 2015-12-09 (×2): qty 1

## 2015-12-09 MED ORDER — OCTREOTIDE ACETATE 100 MCG/ML IJ SOLN: 100.0000 ug | Freq: Three times a day (TID) | INTRAMUSCULAR | Status: DC

## 2015-12-09 MED ORDER — DOCUSATE SODIUM 100 MG PO CAPS: 100.0000 mg | ORAL_CAPSULE | Freq: Two times a day (BID) | ORAL | Status: AC

## 2015-12-09 MED ORDER — OXYCODONE-ACETAMINOPHEN 5-325 MG PO TABS
1.0000 | ORAL_TABLET | ORAL | Status: AC | PRN
Start: 2015-12-09 — End: ?

## 2015-12-09 MED ORDER — POLYETHYLENE GLYCOL 3350 17 G PO PACK: 17.0000 g | PACK | Freq: Every day | ORAL | Status: AC

## 2015-12-09 NOTE — Progress Notes (Signed)
Patient discharged to home with instructions and verbalized understanding. 

## 2015-12-09 NOTE — Care Management (Signed)
SANDOSTATIN 100 MCG SQ TID X 1 MO   COVER- YES  CO-PAY-$ 24.00  PRIOR APPROVAL - NO  PHARMACY: ANT RETAIL

## 2015-12-09 NOTE — Care Management Note (Signed)
Case Management Note  Patient Details  Name: Farrel Connersoemekia Chirco MRN: [id number] Date of Birth: [birth year]  Subjective/Objective:                    Action/Plan:   Expected Discharge Date:                  Expected Discharge Plan:  Home w Home Health Services  In-House Referral:     Discharge planning Services  CM Consult  Post Acute Care Choice:  Home Health Choice offered to:  Patient  DME Arranged:    DME Agency:     HH Arranged:  RN HH Agency:  [facility]  Status of Service:  Completed, signed off  Medicare Important Message Given:  Yes Date Medicare IM Given:    Medicare IM give by:    Date Additional Medicare IM Given:    Additional Medicare Important Message give by:     If discussed at Long Length of Stay Meetings, dates discussed:    Additional Comments:  Kingsley PlanWile, Aubrynn Katona Marie, RN 12/09/2015, 2:34 PM

## 2015-12-09 NOTE — Progress Notes (Signed)
Tech offered Pt a bath, Pt stated she will take care of bath herself.

## 2015-12-09 NOTE — Discharge Summary (Signed)
Physician Discharge Summary  Tiffany Warren YQM:[id number] DOB: 1960-07-22 DOA: 11/27/2015  PCP: No primary care provider on file.  Consultation: none  Admit date: 11/27/2015 Discharge date: 12/09/2015  Recommendations for Outpatient Follow-up:  Follow-up Information    Follow up with Wynona LunaSUEI,MATTHEW K., MD.   Specialty:  General Surgery   Contact information:   [address] [place] [address] [phone]      Discharge Diagnoses:  1. Acute cholecystitis 2. Common bile duct injury   Surgical Procedure: Laparoscopic cholecystectomy with IOC converted to open cholecystectomy with Roux en Y hepaticojejunostomy (11/28/15 Dr.Tsuei)  Discharge Condition: stable Disposition: home  Diet recommendation: regular   Filed Weights   11/27/15 0825 11/27/15 1230  Weight: 74.844 kg (165 lb) 74.844 kg (165 lb)       Hospital Course:  Tiffany Connersoemekia Maus is a 55 year old female who [facility] with nausea and vomiting.  She was found to have  Early acute calculous cholecystitis.  The patient was admitted and underwent the procedure listed above.  Unfortunately, it resulted in a bile leak and the patient remained in the hospital.  The leak was controlled by a surgical drain.  Initially had fairly low output, which increased with time.  HIDA scan and ERCP were unremarkable, but leak was evident on JP Drain output.  The patient was started on sandostatin, however, refused after the first dose due to vomiting.  Output began to trend down and the patient was agreeable to resume.  She was therefore started on 100mcg SQ TID.  Home health was arranged.  The JP bulb drain was changed to a gravity drain.  The patient was instructed on measuring output and caring for the drain.  On the day of discharge, I secured the drain with a stitch.  On POD#11 she was having bowel movements, leak was controlled with the drain, VSS, afebrile, pain well controlled.  She was therefore felt stable for  discharge.   I arranged her follow up.  We discussed warning signs that warrant further evaluation.  She was encouraged to call with questions or concerns.   If the Sandostatin is to be continued after 1 month, it will need to be mailed to a mail order pharmacy as her co pay will by $0 as opposed to $24.    Discharge Instructions     Medication List    TAKE these medications        docusate sodium 100 MG capsule  Commonly known as:  COLACE  Take 1 capsule (100 mg total) by mouth 2 (two) times daily.     octreotide 100 MCG/ML Soln injection  Commonly known as:  SANDOSTATIN  Inject 1 mL (100 mcg total) into the skin 3 (three) times daily.     oxyCODONE-acetaminophen 5-325 MG tablet  Commonly known as:  PERCOCET/ROXICET  Take 1-2 tablets by mouth every 4 (four) hours as needed for moderate pain or severe pain.     pantoprazole 40 MG tablet  Commonly known as:  PROTONIX  Take 40 mg by mouth daily.     polyethylene glycol packet  Commonly known as:  MIRALAX / GLYCOLAX  Take 17 g by mouth daily.       Follow-up Information    Follow up with Wynona LunaSUEI,MATTHEW K., MD On 12/18/2015.   Specialty:  General Surgery   Why:  arrive by 10AM for a 10:30AM post op check   Contact information:   [address] [place] [place]  [place] [phone]        The results of significant diagnostics from this hospitalization (including imaging, microbiology, ancillary and laboratory) are listed below for reference.    Significant Diagnostic Studies: Dg Cholangiogram Operative  11/28/2015  CLINICAL DATA:  55 year old female with a history of cholelithiasis/cholecystitis EXAM: INTRAOPERATIVE CHOLANGIOGRAM TECHNIQUE: Cholangiographic images from the C-arm fluoroscopic device were submitted for interpretation post-operatively. Please see the procedural report for the amount of contrast and the fluoroscopy time utilized. COMPARISON:  Ultrasound 11/27/2015 FINDINGS: Surgical instruments project  over the upper abdomen. There is cannulation of the cystic duct/gallbladder neck, with antegrade infusion of contrast. Caliber of the extrahepatic ductal system within normal limits. No large filling defect identified. Small extraluminal contrast volume Free flow of contrast across the ampulla. IMPRESSION: Intraoperative cholangiogram demonstrates extrahepatic biliary ducts of unremarkable caliber, with no large filling defect identified. Free flow of contrast across the ampulla. Please refer to the dictated operative report for full details of intraoperative findings and procedure Signed, Yvone Neu. Loreta Ave, DO [facility] Electronically Signed   By: Gilmer Mor D.O.   On: 11/28/2015 09:48   Nm Hepatobiliary Liver Func  12/04/2015  CLINICAL DATA:  Evaluate for bile leak.  Cholecystectomy 6 days ago EXAM: NUCLEAR MEDICINE HEPATOBILIARY IMAGING TECHNIQUE: Sequential images of the abdomen were obtained out to 60 minutes following intravenous administration of radiopharmaceutical. RADIOPHARMACEUTICALS:  5.5 mCi Tc-23m  Choletec IV COMPARISON:  Ultrasound of 11/27/2015. FINDINGS: Prompt uptake of tracer by the liver. There is prompt activity in the porta hepatis which is likely in the common duct. Bowel activity is seen by approximately 10 minutes. presumed gastric reflux of tracer beginning at approximately at the 17 minute mark. During the second hour of imaging , there is passage of tracer more distally in the bowel. No persistent tracer in the region of the operative bed. IMPRESSION: No evidence of bile leak. Electronically Signed   By: Jeronimo Greaves M.D.   On: 12/04/2015 19:25   Mr 3d Recon At Scanner  12/08/2015  CLINICAL DATA:  55 year old female status post laparoscopic cholecystectomy with high OC, converted to open cholecystectomy and Roux-en-Y hepaticojejunostomy on 11/28/2015. Evaluate for bile leak. EXAM: MRI ABDOMEN WITHOUT AND WITH CONTRAST  (INCLUDING MRCP) TECHNIQUE: Multiplanar multisequence MR imaging of the abdomen was performed both before and after the administration of intravenous contrast. Heavily T2-weighted images of the biliary and pancreatic ducts were obtained, and three-dimensional MRCP images were rendered by post processing. CONTRAST:  15mL MULTIHANCE GADOBENATE DIMEGLUMINE 529 MG/ML IV SOLN COMPARISON:  No prior abdominal MRI. Abdominal ultrasound 11/27/2015. Nuclear medicine hepatobiliary scan 12/04/2015. FINDINGS: Lower chest: Linear areas of dependent signal intensity in the lung bases bilaterally, presumably areas of mild postoperative subsegmental atelectasis. Hepatobiliary: In segment 2 of the liver and in the caudate lobe there are 5 mm T1 hypointense, T2 hyperintense, nonenhancing lesions, compatible with tiny simple cysts. No other larger cystic or solid hepatic lesions. MRCP images demonstrate no intra or extrahepatic biliary ductal dilatation. Status post cholecystectomy. No unexpected postoperative fluid collections are noted in the gallbladder fossa to suggest the presence of a biloma or bile leak. A surgical drain is seen in the right upper quadrant extending into the region of the gallbladder fossa, with tip terminating beneath the left lobe of the liver. Pancreas: No pancreatic mass. No pancreatic ductal dilatation noted on MRCP images. No significant amount of pancreatic or peripancreatic fluid or inflammatory changes. Spleen: Unremarkable. Adrenals/Urinary Tract: Bilateral adrenal glands  and bilateral kidneys are normal in appearance. No hydroureteronephrosis in the visualized abdomen. Stomach/Bowel: Postoperative changes of reported Roux-en-Y hepaticojejunostomy are difficult to discern on today's MRI examination. Vascular/Lymphatic: No aneurysm identified in the visualized abdominal vasculature. No lymphadenopathy noted in the abdomen. Other: No significant volume of ascites in the visualized peritoneal cavity.  Healing midline abdominal surgical wound. Musculoskeletal: No aggressive osseous lesions are noted in the visualized portions of the skeleton. IMPRESSION: 1. Postoperative changes of recent cholecystectomy and Roux-en-Y hepaticojejunostomy, without abnormal postoperative fluid collection to suggest biliary leak or biloma. 2. No intra or extrahepatic biliary ductal dilatation. 3. Additional incidental findings, as above. Electronically Signed   By: Trudie Reed M.D.   On: 12/08/2015 08:08   Mr Abd W/wo Cm/mrcp  12/08/2015  CLINICAL DATA:  55 year old female status post laparoscopic cholecystectomy with high OC, converted to open cholecystectomy and Roux-en-Y hepaticojejunostomy on 11/28/2015. Evaluate for bile leak. EXAM: MRI ABDOMEN WITHOUT AND WITH CONTRAST (INCLUDING MRCP) TECHNIQUE: Multiplanar multisequence MR imaging of the abdomen was performed both before and after the administration of intravenous contrast. Heavily T2-weighted images of the biliary and pancreatic ducts were obtained, and three-dimensional MRCP images were rendered by post processing. CONTRAST:  15mL MULTIHANCE GADOBENATE DIMEGLUMINE 529 MG/ML IV SOLN COMPARISON:  No prior abdominal MRI. Abdominal ultrasound 11/27/2015. Nuclear medicine hepatobiliary scan 12/04/2015. FINDINGS: Lower chest: Linear areas of dependent signal intensity in the lung bases bilaterally, presumably areas of mild postoperative subsegmental atelectasis. Hepatobiliary: In segment 2 of the liver and in the caudate lobe there are 5 mm T1 hypointense, T2 hyperintense, nonenhancing lesions, compatible with tiny simple cysts. No other larger cystic or solid hepatic lesions. MRCP images demonstrate no intra or extrahepatic biliary ductal dilatation. Status post cholecystectomy. No unexpected postoperative fluid collections are noted in the gallbladder fossa to suggest the presence of a biloma or bile leak. A surgical drain is seen in the right upper quadrant extending  into the region of the gallbladder fossa, with tip terminating beneath the left lobe of the liver. Pancreas: No pancreatic mass. No pancreatic ductal dilatation noted on MRCP images. No significant amount of pancreatic or peripancreatic fluid or inflammatory changes. Spleen: Unremarkable. Adrenals/Urinary Tract: Bilateral adrenal glands and bilateral kidneys are normal in appearance. No hydroureteronephrosis in the visualized abdomen. Stomach/Bowel: Postoperative changes of reported Roux-en-Y hepaticojejunostomy are difficult to discern on today's MRI examination. Vascular/Lymphatic: No aneurysm identified in the visualized abdominal vasculature. No lymphadenopathy noted in the abdomen. Other: No significant volume of ascites in the visualized peritoneal cavity. Healing midline abdominal surgical wound. Musculoskeletal: No aggressive osseous lesions are noted in the visualized portions of the skeleton. IMPRESSION: 1. Postoperative changes of recent cholecystectomy and Roux-en-Y hepaticojejunostomy, without abnormal postoperative fluid collection to suggest biliary leak or biloma. 2. No intra or extrahepatic biliary ductal dilatation. 3. Additional incidental findings, as above. Electronically Signed   By: Trudie Reed M.D.   On: 12/08/2015 08:08   US Abdomen Limited Ruq  11/27/2015  CLINICAL DATA:  Mid abdominal pain since 7 p.m.  Vomiting. EXAM: US ABDOMEN LIMITED - RIGHT UPPER QUADRANT COMPARISON:  None. FINDINGS: Gallbladder: There are echogenic gallstones, largest measuring 1.2 cm. Reportedly, the patient does have a sonographic Murphy's sign. Mild gallbladder wall thickening measuring up to 0.4 cm. No significant distention of the gallbladder. Areas of edematous gallbladder wall. Common bile duct: Diameter: 0.6 cm. Liver: No focal lesion identified. Within normal limits in parenchymal echogenicity. Flow in the main portal vein. IMPRESSION: Cholelithiasis with gallbladder wall thickening and  positive  sonographic Murphy's sign. Findings are suggestive for acute cholecystitis. Electronically Signed   By: Richarda Overlie M.D.   On: 11/27/2015 10:19    Microbiology: No results found for this or any previous visit (from the past 240 hour(s)).   Labs: Basic Metabolic Panel:  Recent Labs Lab 12/04/15 1148 12/05/15 0443  NA 140  --   K 4.0  --   CL 105  --   CO2 28  --   GLUCOSE 120*  --   BUN <5*  --   CREATININE 0.64 0.73  CALCIUM 8.9  --    Liver Function Tests:  Recent Labs Lab 12/04/15 1148  AST 24  ALT 28  ALKPHOS 55  BILITOT 0.1*  PROT 5.7*  ALBUMIN 2.4*   No results for input(s): LIPASE, AMYLASE in the last 168 hours. No results for input(s): AMMONIA in the last 168 hours. CBC: No results for input(s): WBC, NEUTROABS, HGB, HCT, MCV, PLT in the last 168 hours. Cardiac Enzymes: No results for input(s): CKTOTAL, CKMB, CKMBINDEX, TROPONINI in the last 168 hours. BNP: BNP (last 3 results) No results for input(s): BNP in the last 8760 hours.  ProBNP (last 3 results) No results for input(s): PROBNP in the last 8760 hours.  CBG: No results for input(s): GLUCAP in the last 168 hours.  Active Problems:   Acute calculous cholecystitis   Time coordinating discharge: <30 mins   Signed:  Haizlee Henton, ANP-BC

## 2015-12-09 NOTE — Care Management Important Message (Signed)
Important Message  Patient Details  Name: Farrel Connersoemekia Delavega MRN: [id number] Date of Birth: July 25, 1960   Medicare Important Message Given:  Yes    Bernadette HoitShoffner, Lilton Pare Coleman 12/09/2015, 8:10 AM

## 2015-12-09 NOTE — Discharge Instructions (Signed)

## 2015-12-09 NOTE — Progress Notes (Signed)
Patient ID: Tiffany Warren, female   DOB: 1960/07/13, 55 y.o.   MRN: [id number]     [facility]      [address]   [place], [place] [id number]    Phone: [phone] FAX: [phone]     Subjective: Output significantly down.  50ml.   Objective:  Vital signs:  Filed Vitals:   12/08/15 0516 12/08/15 1344 12/08/15 2216 12/09/15 0511  BP: 99/55 105/68 111/50 89/53  Pulse: 71 61 65 68  Temp: 99 F (37.2 C) 97.7 F (36.5 C) 98.2 F (36.8 C) 98.6 F (37 C)  TempSrc: Oral Oral Oral Oral  Resp: 18 18 18 18   Height:      Weight:      SpO2: 100% 100% 100% 100%    Last BM Date: 12/08/15  Intake/Output   Yesterday:  06/13 0701 - 06/14 0700 In: 850 [P.O.:850] Out: 600 [Urine:550; Drains:50] This shift:  Total I/O In: -  Out: 65 [Drains:65]   Physical Exam: General: Pt awake/alert/oriented x4 in no acute distress Abdomen: Soft.  Nondistended. JP drain with bilious output.  Steri strips in place.  No evidence of peritonitis.  No incarcerated hernias.    Problem List:   Active Problems:   Acute calculous cholecystitis    Results:   Labs: No results found for this or any previous visit (from the past 48 hour(s)).  Imaging / Studies: Mr 3d Recon At Scanner  12/08/2015  CLINICAL DATA:  55 year old female status post laparoscopic cholecystectomy with high OC, converted to open cholecystectomy and Roux-en-Y hepaticojejunostomy on 11/28/2015. Evaluate for bile leak. EXAM: MRI ABDOMEN WITHOUT AND WITH CONTRAST (INCLUDING MRCP) TECHNIQUE: Multiplanar multisequence MR imaging of the abdomen was performed both before and after the administration of intravenous contrast. Heavily T2-weighted images of the biliary and pancreatic ducts were obtained, and three-dimensional MRCP images were rendered by post processing. CONTRAST:  15mL MULTIHANCE GADOBENATE DIMEGLUMINE 529 MG/ML IV SOLN COMPARISON:  No prior abdominal MRI. Abdominal  ultrasound 11/27/2015. Nuclear medicine hepatobiliary scan 12/04/2015. FINDINGS: Lower chest: Linear areas of dependent signal intensity in the lung bases bilaterally, presumably areas of mild postoperative subsegmental atelectasis. Hepatobiliary: In segment 2 of the liver and in the caudate lobe there are 5 mm T1 hypointense, T2 hyperintense, nonenhancing lesions, compatible with tiny simple cysts. No other larger cystic or solid hepatic lesions. MRCP images demonstrate no intra or extrahepatic biliary ductal dilatation. Status post cholecystectomy. No unexpected postoperative fluid collections are noted in the gallbladder fossa to suggest the presence of a biloma or bile leak. A surgical drain is seen in the right upper quadrant extending into the region of the gallbladder fossa, with tip terminating beneath the left lobe of the liver. Pancreas: No pancreatic mass. No pancreatic ductal dilatation noted on MRCP images. No significant amount of pancreatic or peripancreatic fluid or inflammatory changes. Spleen: Unremarkable. Adrenals/Urinary Tract: Bilateral adrenal glands and bilateral kidneys are normal in appearance. No hydroureteronephrosis in the visualized abdomen. Stomach/Bowel: Postoperative changes of reported Roux-en-Y hepaticojejunostomy are difficult to discern on today's MRI examination. Vascular/Lymphatic: No aneurysm identified in the visualized abdominal vasculature. No lymphadenopathy noted in the abdomen. Other: No significant volume of ascites in the visualized peritoneal cavity. Healing midline abdominal surgical wound. Musculoskeletal: No aggressive osseous lesions are noted in the visualized portions of the skeleton. IMPRESSION: 1. Postoperative changes of recent cholecystectomy and Roux-en-Y hepaticojejunostomy, without abnormal postoperative fluid collection to suggest biliary leak or biloma. 2. No intra or extrahepatic biliary ductal  dilatation. 3. Additional incidental findings, as above.  Electronically Signed   By: Trudie Reed M.D.   On: 12/08/2015 08:08   Mr Abd W/wo Cm/mrcp  12/08/2015  CLINICAL DATA:  55 year old female status post laparoscopic cholecystectomy with high OC, converted to open cholecystectomy and Roux-en-Y hepaticojejunostomy on 11/28/2015. Evaluate for bile leak. EXAM: MRI ABDOMEN WITHOUT AND WITH CONTRAST (INCLUDING MRCP) TECHNIQUE: Multiplanar multisequence MR imaging of the abdomen was performed both before and after the administration of intravenous contrast. Heavily T2-weighted images of the biliary and pancreatic ducts were obtained, and three-dimensional MRCP images were rendered by post processing. CONTRAST:  15mL MULTIHANCE GADOBENATE DIMEGLUMINE 529 MG/ML IV SOLN COMPARISON:  No prior abdominal MRI. Abdominal ultrasound 11/27/2015. Nuclear medicine hepatobiliary scan 12/04/2015. FINDINGS: Lower chest: Linear areas of dependent signal intensity in the lung bases bilaterally, presumably areas of mild postoperative subsegmental atelectasis. Hepatobiliary: In segment 2 of the liver and in the caudate lobe there are 5 mm T1 hypointense, T2 hyperintense, nonenhancing lesions, compatible with tiny simple cysts. No other larger cystic or solid hepatic lesions. MRCP images demonstrate no intra or extrahepatic biliary ductal dilatation. Status post cholecystectomy. No unexpected postoperative fluid collections are noted in the gallbladder fossa to suggest the presence of a biloma or bile leak. A surgical drain is seen in the right upper quadrant extending into the region of the gallbladder fossa, with tip terminating beneath the left lobe of the liver. Pancreas: No pancreatic mass. No pancreatic ductal dilatation noted on MRCP images. No significant amount of pancreatic or peripancreatic fluid or inflammatory changes. Spleen: Unremarkable. Adrenals/Urinary Tract: Bilateral adrenal glands and bilateral kidneys are normal in appearance. No hydroureteronephrosis in the  visualized abdomen. Stomach/Bowel: Postoperative changes of reported Roux-en-Y hepaticojejunostomy are difficult to discern on today's MRI examination. Vascular/Lymphatic: No aneurysm identified in the visualized abdominal vasculature. No lymphadenopathy noted in the abdomen. Other: No significant volume of ascites in the visualized peritoneal cavity. Healing midline abdominal surgical wound. Musculoskeletal: No aggressive osseous lesions are noted in the visualized portions of the skeleton. IMPRESSION: 1. Postoperative changes of recent cholecystectomy and Roux-en-Y hepaticojejunostomy, without abnormal postoperative fluid collection to suggest biliary leak or biloma. 2. No intra or extrahepatic biliary ductal dilatation. 3. Additional incidental findings, as above. Electronically Signed   By: Trudie Reed M.D.   On: 12/08/2015 08:08    Medications / Allergies:  Scheduled Meds: . docusate sodium  100 mg Oral BID  . enoxaparin (LOVENOX) injection  40 mg Subcutaneous Q24H  . famotidine  20 mg Oral Daily  . feeding supplement (ENSURE ENLIVE)  237 mL Oral BID BM  . octreotide  100 mcg Intravenous TID  . polyethylene glycol  17 g Oral Daily   Continuous Infusions: . 0.9 % NaCl with KCl 20 mEq / L 10 mL/hr at 12/06/15 1103   PRN Meds:.hydrALAZINE, HYDROmorphone (DILAUDID) injection, methocarbamol, naloxone **AND** sodium chloride flush, ondansetron (ZOFRAN) IV, ondansetron **OR** ondansetron (ZOFRAN) IV, oxyCODONE-acetaminophen  Antibiotics: Anti-infectives    Start     Dose/Rate Route Frequency Ordered Stop   11/28/15 1445  cefTRIAXone (ROCEPHIN) 2 g in dextrose 5 % 50 mL IVPB  Status:  Discontinued     2 g 100 mL/hr over 30 Minutes Intravenous Every 24 hours 11/28/15 1431 12/08/15 0916   11/27/15 1130  cefTRIAXone (ROCEPHIN) 2 g in dextrose 5 % 50 mL IVPB  Status:  Discontinued     2 g 100 mL/hr over 30 Minutes Intravenous Every 24 hours 11/27/15 1129 11/28/15 1431  Assessment/Plan Acute cholecystitis with CBD injury POD#11 s/p Laparoscopic cholecystectomy with IOC converted to open cholecystectomy with Roux en Y hepaticojejunostomy (11/28/15 Dr.Tsuei) -Bile leak, controlled, change JP Drain to a gravity bag -Percocet -IS, ambulate -miralax/colace -resume sandostatin, will ask CM if able to inquire about cost, not sure if she will be able to pay for it.  VTE prophylaxis-scd, lovenox  FEN: reg diet Dispo: home later today    Ashok Norris, [facility] Pager [phone]) For consults and floor pages call [phone](7A-4:30P)  12/09/2015 9:16 AM

## 2015-12-11 ENCOUNTER — Other Ambulatory Visit: Payer: Self-pay | Admitting: General Surgery

## 2015-12-11 MED ORDER — OCTREOTIDE ACETATE 100 MCG/ML IJ SOLN: 100.0000 ug | Freq: Three times a day (TID) | INTRAMUSCULAR | Status: AC

## 2016-01-07 ENCOUNTER — Other Ambulatory Visit: Payer: Self-pay | Admitting: Surgery

## 2016-01-07 DIAGNOSIS — S3613XS Injury of bile duct, sequela: Secondary | ICD-10-CM

## 2016-01-18 ENCOUNTER — Other Ambulatory Visit

## 2016-01-23 ENCOUNTER — Ambulatory Visit
Admission: RE | Admit: 2016-01-23 | Discharge: 2016-01-23 | Disposition: A | Discharge status: Home / Self Care | Source: Ambulatory Visit | Attending: Surgery | Admitting: Surgery

## 2016-01-23 DIAGNOSIS — S3613XS Injury of bile duct, sequela: Secondary | ICD-10-CM

## 2016-01-23 MED ORDER — GADOBENATE DIMEGLUMINE 529 MG/ML IV SOLN
14.0000 mL | Freq: Once | INTRAVENOUS | Status: AC | PRN
  Administered 2016-01-23: 14 mL via INTRAVENOUS

## 2016-01-27 NOTE — Progress Notes (Signed)
Please call the patient and let them know that their MRI looked good with no sign of obvious leak or obstruction

## 2018-02-07 IMAGING — MR MR ABDOMEN WO/W CM MRCP
10 of 19 series · 20 of 48 positions shown · IV contrast (multihance)
Comparison: No prior abdominal MRI. Abdominal ultrasound
11/27/2015. Nuclear medicine hepatobiliary scan 12/04/2015.

CLINICAL DATA: 55-year-old female status post laparoscopic
cholecystectomy with high [REDACTED], converted to open cholecystectomy and
Roux-en-Y hepaticojejunostomy on 11/28/2015. Evaluate for bile leak.

EXAM:
MRI ABDOMEN WITHOUT AND WITH CONTRAST (INCLUDING MRCP)
TECHNIQUE: Multiplanar multisequence MR imaging of the abdomen was performed
both before and after the administration of intravenous contrast.
Heavily T2-weighted images of the biliary and pancreatic ducts were
obtained, and three-dimensional MRCP images were rendered by post
processing.
CONTRAST:  15mL MULTIHANCE GADOBENATE DIMEGLUMINE 529 MG/ML IV SOLN

[Series 3: T2 fat-sat · axial · 5.0mm · 0.78mm/px · z∈[-81,+128]mm · 3 of 43 slices shown]
[im 1/43]
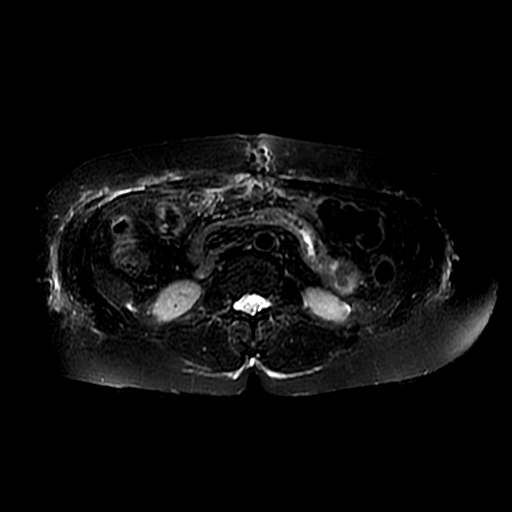
[im 22/43]
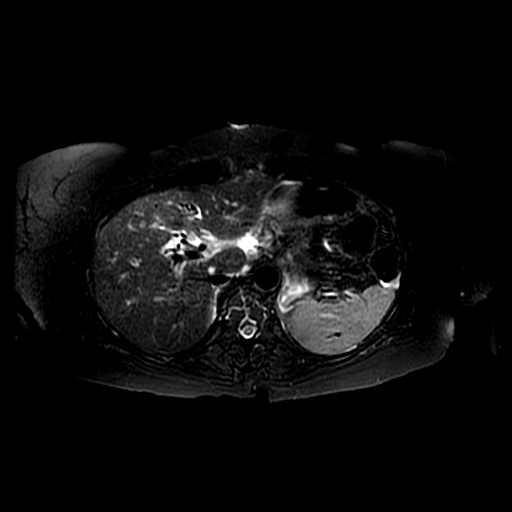
[im 43/43]
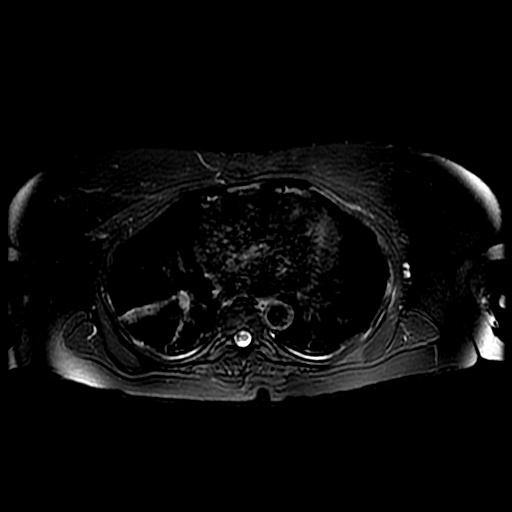

[Series 4: T2 · axial · 5.0mm · 0.78mm/px · z∈[-81,+128]mm · 2 of 43 slices shown (1 of 2)]
[im 1/43]
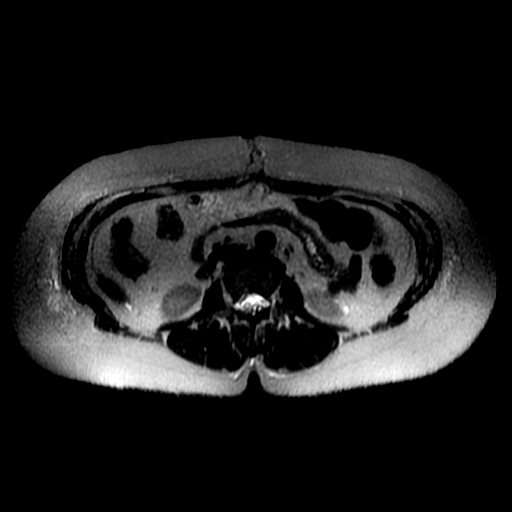
[im 43/43]
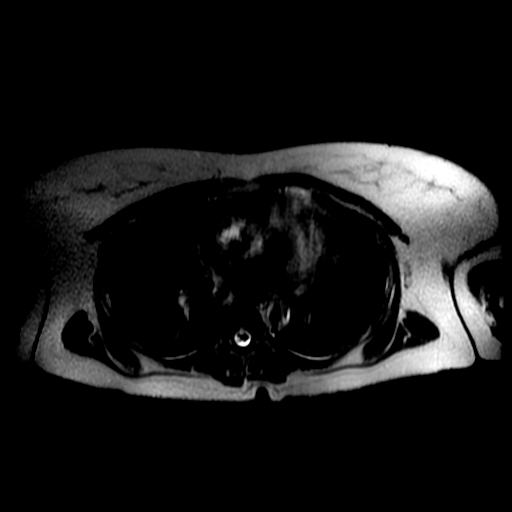

[Series 5: T2 · coronal · 5.0mm · 0.82mm/px · 1 of 33 slices shown (2 of 2)]
[im 1/33]
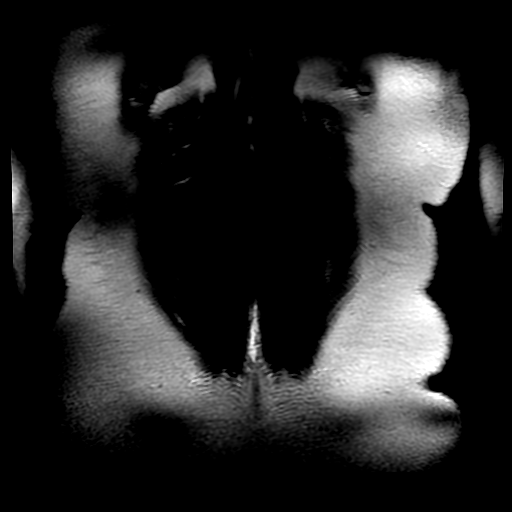

[Series 7: MRCP · coronal · 2.0mm · 0.70mm/px · 1 of 38 slices shown (1 of 2)]
[im 1/38]
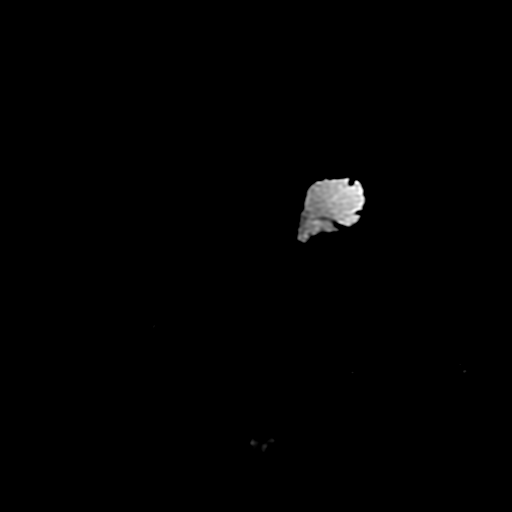

[Series 8: DWI b500 · axial · 6.0mm · 1.48mm/px · z∈[-92,+135]mm · 2 of 60 slices shown]
[im 1/60]
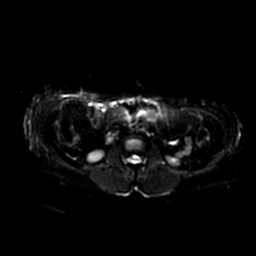
[im 60/60]
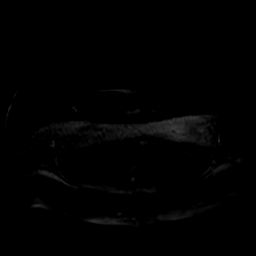

[Series 11: ax dualecho · axial · 5.0mm · 0.78mm/px · z∈[-86,+124]mm · 3 of 86 slices shown]
[im 1/86]
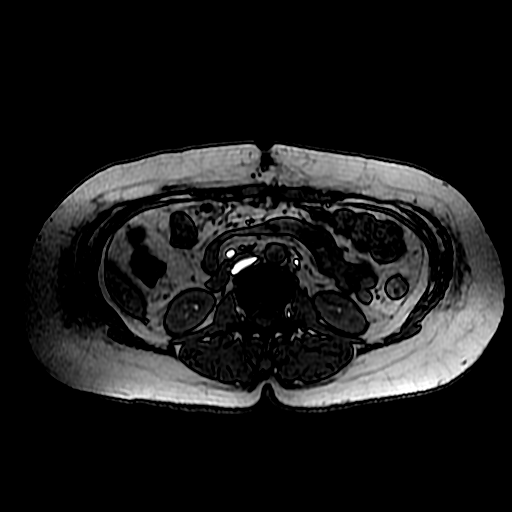
[im 43/86]
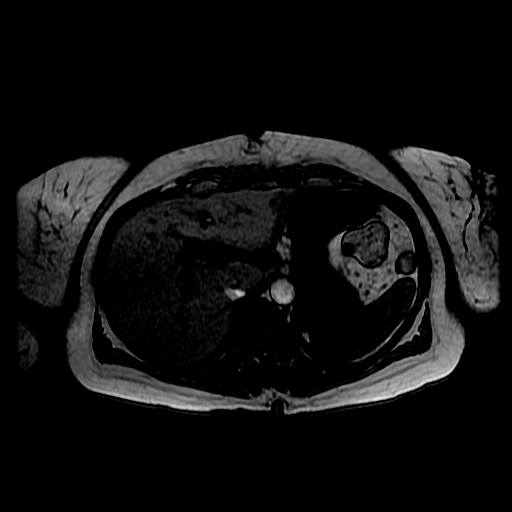
[im 86/86]
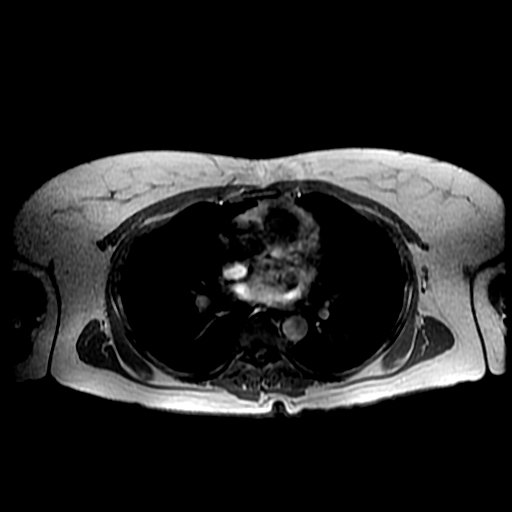

[Series 13: MRCP · coronal · 50.0mm · 0.70mm/px · 1 of 5 slices shown (2 of 2)]
[im 1/5]
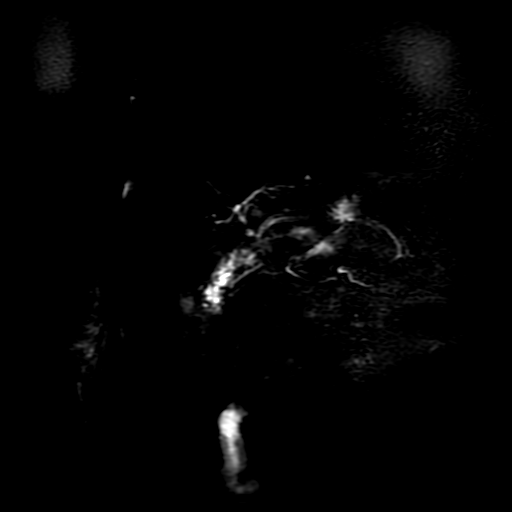

[Series 16: T1 dynamic post-contrast · coronal · 5.0mm · 0.78mm/px · 3 of 88 slices shown]
[im 1/88]
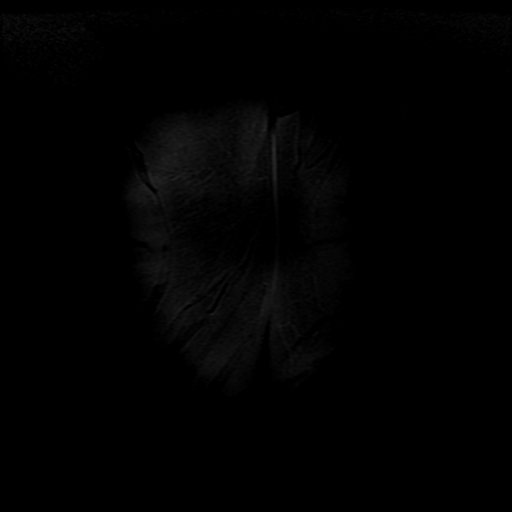
[im 44/88]
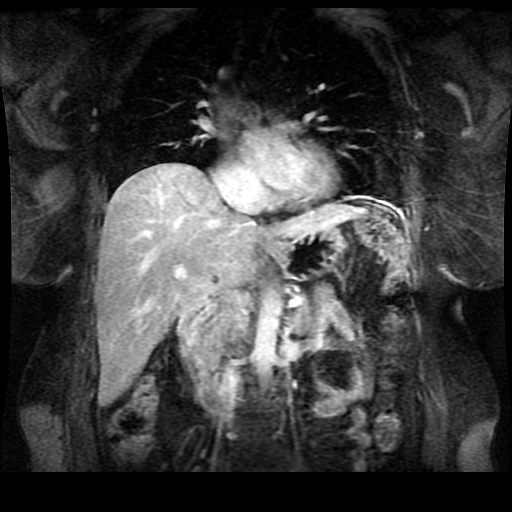
[im 88/88]
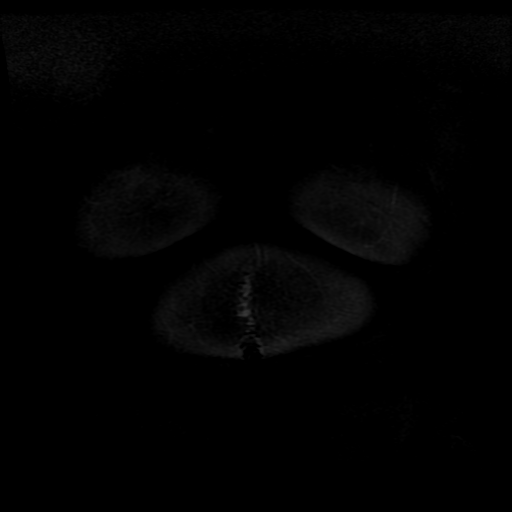

[Series 800: DWI · axial · 6.0mm · 1.48mm/px · 1 of 30 slices shown]
[im 1/30]
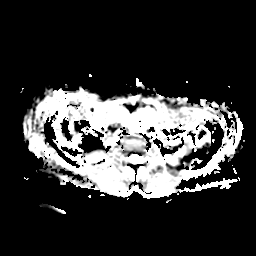

[Series 1500: T1 dynamic · axial · 5.0mm · 0.78mm/px · z∈[-93,+124]mm · 3 of 88 slices shown]
[im 1/88]
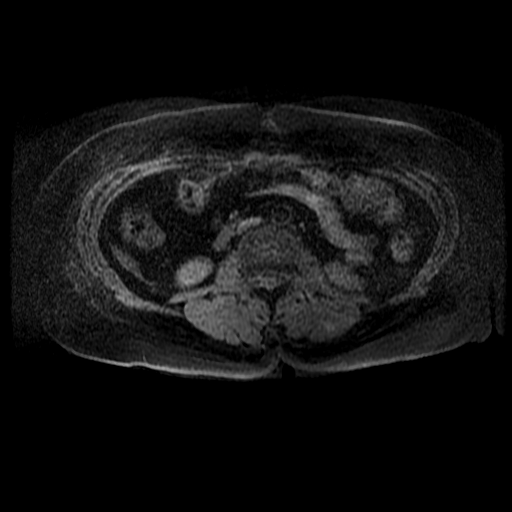
[im 44/88]
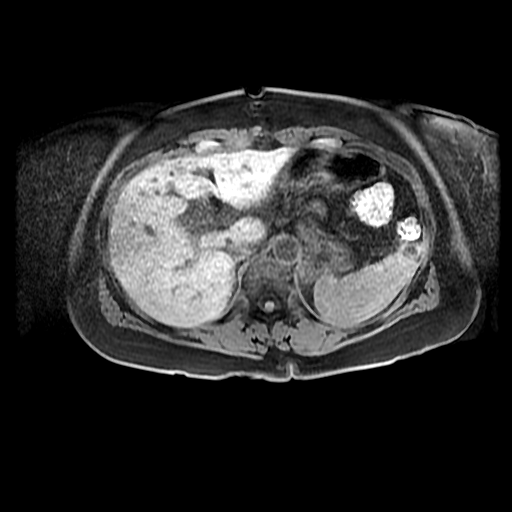
[im 88/88]
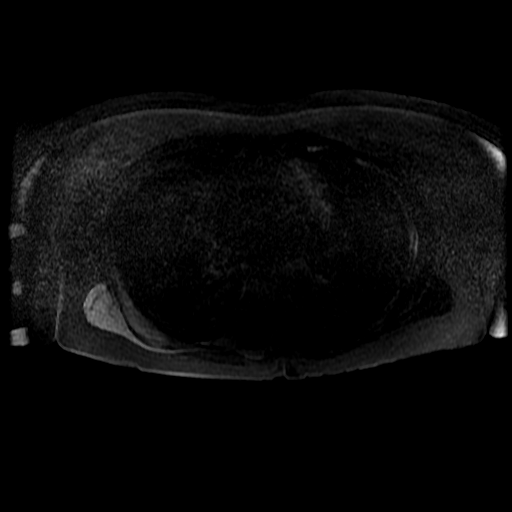

[20 of 48 positions shown; findings below may reference images not displayed]

FINDINGS: Lower chest: Linear areas of dependent signal intensity in the lung
bases bilaterally, presumably areas of mild postoperative
subsegmental atelectasis.

Hepatobiliary: In segment 2 of the liver and in the caudate lobe
there are 5 mm T1 hypointense, T2 hyperintense, nonenhancing
lesions, compatible with tiny simple cysts. No other larger cystic
or solid hepatic lesions. MRCP images demonstrate no intra or
extrahepatic biliary ductal dilatation. Status post cholecystectomy.
No unexpected postoperative fluid collections are noted in the
gallbladder fossa to suggest the presence of a biloma or bile leak.
A surgical drain is seen in the right upper quadrant extending into
the region of the gallbladder fossa, with tip terminating beneath
the left lobe of the liver.

Pancreas: No pancreatic mass. No pancreatic ductal dilatation noted
on MRCP images. No significant amount of pancreatic or
peripancreatic fluid or inflammatory changes.

Spleen: Unremarkable.

Adrenals/Urinary Tract: Bilateral adrenal glands and bilateral
kidneys are normal in appearance. No hydroureteronephrosis in the
visualized abdomen.

Stomach/Bowel: Postoperative changes of reported Roux-en-Y
hepaticojejunostomy are difficult to discern on today's MRI
examination.

Vascular/Lymphatic: No aneurysm identified in the visualized
abdominal vasculature. No lymphadenopathy noted in the abdomen.

Other: No significant volume of ascites in the visualized peritoneal
cavity. Healing midline abdominal surgical wound.

Musculoskeletal: No aggressive osseous lesions are noted in the
visualized portions of the skeleton.
IMPRESSION: 1. Postoperative changes of recent cholecystectomy and Roux-en-Y
hepaticojejunostomy, without abnormal postoperative fluid collection
to suggest biliary leak or biloma.
2. No intra or extrahepatic biliary ductal dilatation.
3. Additional incidental findings, as above.

## 2019-08-31 ENCOUNTER — Ambulatory Visit: Attending: Internal Medicine

## 2019-08-31 DIAGNOSIS — Z23 Encounter for immunization: Secondary | ICD-10-CM | POA: Insufficient documentation

## 2019-08-31 NOTE — Progress Notes (Signed)
   Covid-19 Vaccination Clinic  Name:  Tiffany Warren    MRN: [id number] DOB: 09-16-1960  08/31/2019  [name] was observed post Covid-19 immunization for 15 minutes without incident. She was provided with Vaccine Information Sheet and instruction to access the V-Safe system.   [name] was instructed to call 911 with any severe reactions post vaccine: Marland Kitchen Difficulty breathing  . Swelling of face and throat  . A fast heartbeat  . A bad rash all over body  . Dizziness and weakness   Immunizations Administered    Name Date Dose VIS Date Route   Pfizer COVID-19 Vaccine 08/31/2019  3:55 PM 0.3 mL 06/07/2019 Intramuscular   Manufacturer: [employer], [employer]   Lot: [id number]   NDC: [address]-1

## 2019-10-02 ENCOUNTER — Ambulatory Visit: Attending: Internal Medicine

## 2019-10-02 DIAGNOSIS — Z23 Encounter for immunization: Secondary | ICD-10-CM

## 2019-10-02 NOTE — Progress Notes (Signed)
   Covid-19 Vaccination Clinic  Name:  Tiffany Warren    MRN: [id number] DOB: 06-18-1961  10/02/2019  [name] was observed post Covid-19 immunization for 15 minutes without incident. She was provided with Vaccine Information Sheet and instruction to access the V-Safe system.   [name] was instructed to call 911 with any severe reactions post vaccine: Marland Kitchen Difficulty breathing  . Swelling of face and throat  . A fast heartbeat  . A bad rash all over body  . Dizziness and weakness   Immunizations Administered    Name Date Dose VIS Date Route   Pfizer COVID-19 Vaccine 10/02/2019  8:15 AM 0.3 mL 06/07/2019 Intramuscular   Manufacturer: [employer], [employer]   Lot: [id number]   NDC: [address]-6

## 2020-04-07 ENCOUNTER — Encounter (HOSPITAL_BASED_OUTPATIENT_CLINIC_OR_DEPARTMENT_OTHER): Payer: Self-pay

## 2020-04-07 DIAGNOSIS — R0683 Snoring: Secondary | ICD-10-CM

## 2020-05-13 ENCOUNTER — Ambulatory Visit (HOSPITAL_BASED_OUTPATIENT_CLINIC_OR_DEPARTMENT_OTHER): Attending: Internal Medicine | Admitting: Internal Medicine

## 2020-05-13 ENCOUNTER — Other Ambulatory Visit: Payer: Self-pay

## 2020-05-13 VITALS — Ht 62.0 in | Wt 170.0 lb

## 2020-05-13 DIAGNOSIS — R059 Cough, unspecified: Secondary | ICD-10-CM | POA: Diagnosis not present

## 2020-05-13 DIAGNOSIS — R0683 Snoring: Secondary | ICD-10-CM | POA: Diagnosis not present

## 2020-05-13 DIAGNOSIS — G479 Sleep disorder, unspecified: Secondary | ICD-10-CM | POA: Diagnosis not present

## 2020-05-31 NOTE — Procedures (Signed)
    Patient Name: Tiffany Warren, Tiffany Warren Date: 05/13/2020 Gender: Female D.O.B: 1961-06-06 Age (years): 35 Referring Provider: Jetty Duhamel MD, ABSM Height (inches): 62 Interpreting Physician: Jetty Duhamel MD, ABSM Weight (lbs): 170 RPSGT: Armen Pickup BMI: 31 MRN: [id number] Neck Size: 13.00  CLINICAL INFORMATION Sleep Study Type: NPS Indication for sleep study: Snoring Epworth Sleepiness Score: 2  SLEEP STUDY TECHNIQUE As per the AASM Manual for the Scoring of Sleep and Associated Events v2.3 (April 2016) with a hypopnea requiring 4% desaturations.  The channels recorded and monitored were frontal, central and occipital EEG, electrooculogram (EOG), submentalis EMG (chin), nasal and oral airflow, thoracic and abdominal wall motion, anterior tibialis EMG, snore microphone, electrocardiogram, and pulse oximetry.  MEDICATIONS Medications self-administered by patient taken the night of the study : none reported  SLEEP ARCHITECTURE The study was initiated at 11:11:04 PM and ended at 5:04:30 AM. Sleep onset time was 14.4 minutes and the sleep efficiency was 56.6%%. The total sleep time was 200 minutes. Stage REM latency was 160.5 minutes. The patient spent 7.3%% of the night in stage N1 sleep, 64.8%% in stage N2 sleep, 13.3%% in stage N3 and 14.8% in REM.  Alpha intrusion was absent.  Supine sleep was 13.75%.  RESPIRATORY PARAMETERS The overall apnea/hypopnea index (AHI) was 0.6 per hour. There were 1 total apneas, including 1 obstructive, 0 central and 0 mixed apneas. There were 1 hypopneas and 11 RERAs.  The AHI during Stage REM sleep was 4.1 per hour.  AHI while supine was 2.2 per hour.  The mean oxygen saturation was 94.8%. The minimum SpO2 during sleep was 90.0%.  soft snoring was noted during this study.  CARDIAC DATA The 2 lead EKG demonstrated sinus rhythm. The mean heart rate was 56.0 beats per minute. Other EKG findings include: None.  LEG MOVEMENT DATA The  total PLMS were 0 with a resulting PLMS index of 0.0. Associated arousal with leg movement index was 0.0 .  IMPRESSIONS - No significant obstructive sleep apnea occurred during this study (AHI = 0.6/h). - No significant central sleep apnea occurred during this study (CAI = 0.0/h). - The patient had minimal or no oxygen desaturation during the study (Min O2 = 90.0%) - The patient snored with mild to moderate snoring volume. - No cardiac abnormalities were noted during this study. - Clinically significant periodic limb movements did not occur during sleep. - Several nonspecific awakenings. If typical of home environment, consider managing as insomnia. - Tech commented on "a lot of coughing throughout the night".  DIAGNOSIS - Other sleep disorder  RECOMMENDATIONS - Manage for snoring and symptoms based on clinical judgment. - Sleep hygiene should be reviewed to assess factors that may improve sleep quality. - Weight management and regular exercise should be initiated or continued if appropriate.  [Electronically signed] 05/31/2020 11:09 AM  Jetty Duhamel MD, ABSM Diplomate, American Board of Sleep Medicine   NPI: [id number]                         Jetty Duhamel Diplomate, American Board of Sleep Medicine  ELECTRONICALLY SIGNED ON:  05/31/2020, 11:01 AM [facility] PH: [phone]   FX: [phone] ACCREDITED BY THE AMERICAN ACADEMY OF SLEEP MEDICINE

## 2020-09-02 DIAGNOSIS — E119 Type 2 diabetes mellitus without complications: Secondary | ICD-10-CM | POA: Diagnosis not present

## 2020-12-12 DIAGNOSIS — Z20822 Contact with and (suspected) exposure to covid-19: Secondary | ICD-10-CM | POA: Diagnosis not present

## 2020-12-12 DIAGNOSIS — Z6831 Body mass index (BMI) 31.0-31.9, adult: Secondary | ICD-10-CM | POA: Diagnosis not present

## 2020-12-30 DIAGNOSIS — Z0000 Encounter for general adult medical examination without abnormal findings: Secondary | ICD-10-CM | POA: Diagnosis not present

## 2020-12-30 DIAGNOSIS — Z1231 Encounter for screening mammogram for malignant neoplasm of breast: Secondary | ICD-10-CM | POA: Diagnosis not present

## 2020-12-30 DIAGNOSIS — R946 Abnormal results of thyroid function studies: Secondary | ICD-10-CM | POA: Diagnosis not present

## 2020-12-30 DIAGNOSIS — E119 Type 2 diabetes mellitus without complications: Secondary | ICD-10-CM | POA: Diagnosis not present

## 2021-01-12 DIAGNOSIS — Z113 Encounter for screening for infections with a predominantly sexual mode of transmission: Secondary | ICD-10-CM | POA: Diagnosis not present

## 2021-01-12 DIAGNOSIS — Z1151 Encounter for screening for human papillomavirus (HPV): Secondary | ICD-10-CM | POA: Diagnosis not present

## 2021-01-12 DIAGNOSIS — Z124 Encounter for screening for malignant neoplasm of cervix: Secondary | ICD-10-CM | POA: Diagnosis not present

## 2021-01-12 DIAGNOSIS — E119 Type 2 diabetes mellitus without complications: Secondary | ICD-10-CM | POA: Diagnosis not present

## 2021-01-12 DIAGNOSIS — Z23 Encounter for immunization: Secondary | ICD-10-CM | POA: Diagnosis not present

## 2021-01-12 DIAGNOSIS — Z0000 Encounter for general adult medical examination without abnormal findings: Secondary | ICD-10-CM | POA: Diagnosis not present

## 2021-01-14 DIAGNOSIS — J45991 Cough variant asthma: Secondary | ICD-10-CM | POA: Diagnosis not present

## 2021-05-25 DIAGNOSIS — J208 Acute bronchitis due to other specified organisms: Secondary | ICD-10-CM | POA: Diagnosis not present

## 2021-05-25 DIAGNOSIS — B9689 Other specified bacterial agents as the cause of diseases classified elsewhere: Secondary | ICD-10-CM | POA: Diagnosis not present

## 2021-05-25 DIAGNOSIS — J4541 Moderate persistent asthma with (acute) exacerbation: Secondary | ICD-10-CM | POA: Diagnosis not present

## 2021-05-25 DIAGNOSIS — B349 Viral infection, unspecified: Secondary | ICD-10-CM | POA: Diagnosis not present

## 2021-07-14 DIAGNOSIS — E119 Type 2 diabetes mellitus without complications: Secondary | ICD-10-CM | POA: Diagnosis not present

## 2021-07-27 DIAGNOSIS — J45991 Cough variant asthma: Secondary | ICD-10-CM | POA: Diagnosis not present

## 2021-07-27 DIAGNOSIS — R053 Chronic cough: Secondary | ICD-10-CM | POA: Diagnosis not present

## 2021-07-27 DIAGNOSIS — Z23 Encounter for immunization: Secondary | ICD-10-CM | POA: Diagnosis not present

## 2021-07-27 DIAGNOSIS — J849 Interstitial pulmonary disease, unspecified: Secondary | ICD-10-CM | POA: Diagnosis not present

## 2021-08-08 DIAGNOSIS — Z23 Encounter for immunization: Secondary | ICD-10-CM | POA: Diagnosis not present

## 2021-10-12 DIAGNOSIS — E119 Type 2 diabetes mellitus without complications: Secondary | ICD-10-CM | POA: Diagnosis not present

## 2021-10-15 DIAGNOSIS — Z23 Encounter for immunization: Secondary | ICD-10-CM | POA: Diagnosis not present

## 2022-01-11 DIAGNOSIS — R946 Abnormal results of thyroid function studies: Secondary | ICD-10-CM | POA: Diagnosis not present

## 2022-01-11 DIAGNOSIS — Z0000 Encounter for general adult medical examination without abnormal findings: Secondary | ICD-10-CM | POA: Diagnosis not present

## 2022-01-11 DIAGNOSIS — E119 Type 2 diabetes mellitus without complications: Secondary | ICD-10-CM | POA: Diagnosis not present

## 2022-01-18 DIAGNOSIS — Z0000 Encounter for general adult medical examination without abnormal findings: Secondary | ICD-10-CM | POA: Diagnosis not present

## 2022-01-18 DIAGNOSIS — J45991 Cough variant asthma: Secondary | ICD-10-CM | POA: Diagnosis not present

## 2022-01-18 DIAGNOSIS — E119 Type 2 diabetes mellitus without complications: Secondary | ICD-10-CM | POA: Diagnosis not present

## 2022-01-18 DIAGNOSIS — Z9109 Other allergy status, other than to drugs and biological substances: Secondary | ICD-10-CM | POA: Diagnosis not present

## 2022-01-19 DIAGNOSIS — J849 Interstitial pulmonary disease, unspecified: Secondary | ICD-10-CM | POA: Diagnosis not present

## 2022-01-19 DIAGNOSIS — R918 Other nonspecific abnormal finding of lung field: Secondary | ICD-10-CM | POA: Diagnosis not present

## 2022-01-19 DIAGNOSIS — J45991 Cough variant asthma: Secondary | ICD-10-CM | POA: Diagnosis not present

## 2022-01-28 DIAGNOSIS — Z1231 Encounter for screening mammogram for malignant neoplasm of breast: Secondary | ICD-10-CM | POA: Diagnosis not present

## 2022-07-21 DIAGNOSIS — M75102 Unspecified rotator cuff tear or rupture of left shoulder, not specified as traumatic: Secondary | ICD-10-CM | POA: Diagnosis not present

## 2022-07-21 DIAGNOSIS — M12812 Other specific arthropathies, not elsewhere classified, left shoulder: Secondary | ICD-10-CM | POA: Diagnosis not present

## 2022-07-26 DIAGNOSIS — J45991 Cough variant asthma: Secondary | ICD-10-CM | POA: Diagnosis not present

## 2022-07-26 DIAGNOSIS — Z23 Encounter for immunization: Secondary | ICD-10-CM | POA: Diagnosis not present

## 2022-08-18 DIAGNOSIS — R7309 Other abnormal glucose: Secondary | ICD-10-CM | POA: Diagnosis not present

## 2022-08-18 DIAGNOSIS — R946 Abnormal results of thyroid function studies: Secondary | ICD-10-CM | POA: Diagnosis not present

## 2022-08-18 DIAGNOSIS — Z79899 Other long term (current) drug therapy: Secondary | ICD-10-CM | POA: Diagnosis not present

## 2022-08-18 DIAGNOSIS — Z13220 Encounter for screening for lipoid disorders: Secondary | ICD-10-CM | POA: Diagnosis not present

## 2022-08-25 DIAGNOSIS — J45991 Cough variant asthma: Secondary | ICD-10-CM | POA: Diagnosis not present

## 2022-08-25 DIAGNOSIS — E7800 Pure hypercholesterolemia, unspecified: Secondary | ICD-10-CM | POA: Diagnosis not present

## 2022-08-25 DIAGNOSIS — E119 Type 2 diabetes mellitus without complications: Secondary | ICD-10-CM | POA: Diagnosis not present

## 2022-08-25 DIAGNOSIS — Z0000 Encounter for general adult medical examination without abnormal findings: Secondary | ICD-10-CM | POA: Diagnosis not present

## 2022-09-10 DIAGNOSIS — J159 Unspecified bacterial pneumonia: Secondary | ICD-10-CM | POA: Diagnosis not present

## 2022-09-10 DIAGNOSIS — R509 Fever, unspecified: Secondary | ICD-10-CM | POA: Diagnosis not present

## 2022-09-10 DIAGNOSIS — Z20822 Contact with and (suspected) exposure to covid-19: Secondary | ICD-10-CM | POA: Diagnosis not present

## 2022-09-10 DIAGNOSIS — R059 Cough, unspecified: Secondary | ICD-10-CM | POA: Diagnosis not present

## 2022-09-10 DIAGNOSIS — Z6830 Body mass index (BMI) 30.0-30.9, adult: Secondary | ICD-10-CM | POA: Diagnosis not present

## 2022-09-10 DIAGNOSIS — J209 Acute bronchitis, unspecified: Secondary | ICD-10-CM | POA: Diagnosis not present

## 2022-09-10 DIAGNOSIS — R0602 Shortness of breath: Secondary | ICD-10-CM | POA: Diagnosis not present

## 2022-12-14 DIAGNOSIS — E7800 Pure hypercholesterolemia, unspecified: Secondary | ICD-10-CM | POA: Diagnosis not present

## 2022-12-14 DIAGNOSIS — E119 Type 2 diabetes mellitus without complications: Secondary | ICD-10-CM | POA: Diagnosis not present

## 2022-12-28 DIAGNOSIS — J45991 Cough variant asthma: Secondary | ICD-10-CM | POA: Diagnosis not present

## 2022-12-28 DIAGNOSIS — E7800 Pure hypercholesterolemia, unspecified: Secondary | ICD-10-CM | POA: Diagnosis not present

## 2022-12-28 DIAGNOSIS — E119 Type 2 diabetes mellitus without complications: Secondary | ICD-10-CM | POA: Diagnosis not present

## 2023-01-10 DIAGNOSIS — Z800 Family history of malignant neoplasm of digestive organs: Secondary | ICD-10-CM | POA: Diagnosis not present

## 2023-01-10 DIAGNOSIS — Z1211 Encounter for screening for malignant neoplasm of colon: Secondary | ICD-10-CM | POA: Diagnosis not present

## 2023-01-10 DIAGNOSIS — K5900 Constipation, unspecified: Secondary | ICD-10-CM | POA: Diagnosis not present

## 2023-01-26 DIAGNOSIS — J45991 Cough variant asthma: Secondary | ICD-10-CM | POA: Diagnosis not present

## 2023-02-01 DIAGNOSIS — Z1211 Encounter for screening for malignant neoplasm of colon: Secondary | ICD-10-CM | POA: Diagnosis not present

## 2023-02-01 DIAGNOSIS — D175 Benign lipomatous neoplasm of intra-abdominal organs: Secondary | ICD-10-CM | POA: Diagnosis not present

## 2023-02-01 DIAGNOSIS — Z800 Family history of malignant neoplasm of digestive organs: Secondary | ICD-10-CM | POA: Diagnosis not present

## 2023-02-01 DIAGNOSIS — K648 Other hemorrhoids: Secondary | ICD-10-CM | POA: Diagnosis not present

## 2023-02-01 DIAGNOSIS — K5730 Diverticulosis of large intestine without perforation or abscess without bleeding: Secondary | ICD-10-CM | POA: Diagnosis not present

## 2023-02-08 DIAGNOSIS — M545 Low back pain, unspecified: Secondary | ICD-10-CM | POA: Diagnosis not present

## 2023-02-08 DIAGNOSIS — M5116 Intervertebral disc disorders with radiculopathy, lumbar region: Secondary | ICD-10-CM | POA: Diagnosis not present

## 2023-05-08 DIAGNOSIS — Z79899 Other long term (current) drug therapy: Secondary | ICD-10-CM | POA: Diagnosis not present

## 2023-05-08 DIAGNOSIS — E7800 Pure hypercholesterolemia, unspecified: Secondary | ICD-10-CM | POA: Diagnosis not present

## 2023-05-08 DIAGNOSIS — E119 Type 2 diabetes mellitus without complications: Secondary | ICD-10-CM | POA: Diagnosis not present

## 2023-05-15 DIAGNOSIS — J45991 Cough variant asthma: Secondary | ICD-10-CM | POA: Diagnosis not present

## 2023-05-15 DIAGNOSIS — E119 Type 2 diabetes mellitus without complications: Secondary | ICD-10-CM | POA: Diagnosis not present

## 2023-08-10 DIAGNOSIS — J45991 Cough variant asthma: Secondary | ICD-10-CM | POA: Diagnosis not present

## 2023-08-10 DIAGNOSIS — Z23 Encounter for immunization: Secondary | ICD-10-CM | POA: Diagnosis not present

## 2023-09-04 DIAGNOSIS — R946 Abnormal results of thyroid function studies: Secondary | ICD-10-CM | POA: Diagnosis not present

## 2023-09-04 DIAGNOSIS — E119 Type 2 diabetes mellitus without complications: Secondary | ICD-10-CM | POA: Diagnosis not present

## 2023-09-04 DIAGNOSIS — E7800 Pure hypercholesterolemia, unspecified: Secondary | ICD-10-CM | POA: Diagnosis not present

## 2023-09-04 DIAGNOSIS — Z79899 Other long term (current) drug therapy: Secondary | ICD-10-CM | POA: Diagnosis not present

## 2023-09-14 DIAGNOSIS — Z0000 Encounter for general adult medical examination without abnormal findings: Secondary | ICD-10-CM | POA: Diagnosis not present

## 2023-09-26 DIAGNOSIS — R92323 Mammographic fibroglandular density, bilateral breasts: Secondary | ICD-10-CM | POA: Diagnosis not present

## 2023-09-26 DIAGNOSIS — Z1231 Encounter for screening mammogram for malignant neoplasm of breast: Secondary | ICD-10-CM | POA: Diagnosis not present

## 2024-02-01 DIAGNOSIS — F4320 Adjustment disorder, unspecified: Secondary | ICD-10-CM | POA: Diagnosis not present

## 2024-03-08 DIAGNOSIS — E119 Type 2 diabetes mellitus without complications: Secondary | ICD-10-CM | POA: Diagnosis not present

## 2024-03-08 DIAGNOSIS — Z79899 Other long term (current) drug therapy: Secondary | ICD-10-CM | POA: Diagnosis not present

## 2024-03-08 DIAGNOSIS — R946 Abnormal results of thyroid function studies: Secondary | ICD-10-CM | POA: Diagnosis not present

## 2024-03-15 DIAGNOSIS — J45991 Cough variant asthma: Secondary | ICD-10-CM | POA: Diagnosis not present

## 2024-03-18 DIAGNOSIS — Z9109 Other allergy status, other than to drugs and biological substances: Secondary | ICD-10-CM | POA: Diagnosis not present

## 2024-03-18 DIAGNOSIS — J45991 Cough variant asthma: Secondary | ICD-10-CM | POA: Diagnosis not present

## 2024-03-18 DIAGNOSIS — R946 Abnormal results of thyroid function studies: Secondary | ICD-10-CM | POA: Diagnosis not present

## 2024-03-18 DIAGNOSIS — E119 Type 2 diabetes mellitus without complications: Secondary | ICD-10-CM | POA: Diagnosis not present

## 2024-03-18 DIAGNOSIS — Z23 Encounter for immunization: Secondary | ICD-10-CM | POA: Diagnosis not present
